# Patient Record
Sex: Male | Born: 1940 | Race: White | Hispanic: No | Marital: Single | Smoking: Never smoker
Health system: Southern US, Community
[De-identification: ages and names within clinical notes are randomized; demographics above are authoritative.]

## PROBLEM LIST (undated history)

## (undated) DIAGNOSIS — E785 Hyperlipidemia, unspecified: Secondary | ICD-10-CM

## (undated) DIAGNOSIS — R42 Dizziness and giddiness: Secondary | ICD-10-CM

## (undated) DIAGNOSIS — K5790 Diverticulosis of intestine, part unspecified, without perforation or abscess without bleeding: Secondary | ICD-10-CM

## (undated) DIAGNOSIS — M1990 Unspecified osteoarthritis, unspecified site: Secondary | ICD-10-CM

## (undated) DIAGNOSIS — T8859XA Other complications of anesthesia, initial encounter: Secondary | ICD-10-CM

## (undated) DIAGNOSIS — E039 Hypothyroidism, unspecified: Secondary | ICD-10-CM

## (undated) HISTORY — DX: Hyperlipidemia, unspecified: E78.5

## (undated) HISTORY — DX: Hypothyroidism, unspecified: E03.9

## (undated) HISTORY — DX: Diverticulosis of intestine, part unspecified, without perforation or abscess without bleeding: K57.90

## (undated) HISTORY — PX: HERNIA REPAIR: SHX51

---

## 2001-12-01 ENCOUNTER — Ambulatory Visit (HOSPITAL_COMMUNITY): Admission: RE | Admit: 2001-12-01 | Discharge: 2001-12-01 | Payer: Self-pay | Admitting: Family Medicine

## 2001-12-01 ENCOUNTER — Encounter: Payer: Self-pay | Admitting: Family Medicine

## 2002-01-03 ENCOUNTER — Ambulatory Visit (HOSPITAL_COMMUNITY): Admission: RE | Admit: 2002-01-03 | Discharge: 2002-01-03 | Payer: Self-pay | Admitting: Internal Medicine

## 2002-01-03 HISTORY — PX: COLONOSCOPY: SHX174

## 2004-10-29 ENCOUNTER — Ambulatory Visit (HOSPITAL_COMMUNITY): Admission: RE | Admit: 2004-10-29 | Discharge: 2004-10-29 | Payer: Self-pay | Admitting: Family Medicine

## 2004-11-11 ENCOUNTER — Ambulatory Visit: Payer: Self-pay | Admitting: *Deleted

## 2004-11-25 ENCOUNTER — Ambulatory Visit (HOSPITAL_COMMUNITY): Admission: RE | Admit: 2004-11-25 | Discharge: 2004-11-25 | Payer: Self-pay | Admitting: *Deleted

## 2004-11-25 ENCOUNTER — Ambulatory Visit: Payer: Self-pay | Admitting: Cardiology

## 2004-12-02 ENCOUNTER — Ambulatory Visit: Payer: Self-pay | Admitting: *Deleted

## 2005-08-09 ENCOUNTER — Emergency Department (HOSPITAL_COMMUNITY): Admission: EM | Admit: 2005-08-09 | Discharge: 2005-08-09 | Payer: Self-pay | Admitting: Emergency Medicine

## 2007-01-13 ENCOUNTER — Ambulatory Visit (HOSPITAL_COMMUNITY): Admission: RE | Admit: 2007-01-13 | Discharge: 2007-01-13 | Payer: Self-pay | Admitting: Internal Medicine

## 2007-01-13 ENCOUNTER — Ambulatory Visit: Payer: Self-pay | Admitting: Internal Medicine

## 2007-01-13 HISTORY — PX: COLONOSCOPY: SHX174

## 2007-04-21 ENCOUNTER — Ambulatory Visit (HOSPITAL_COMMUNITY): Admission: RE | Admit: 2007-04-21 | Discharge: 2007-04-21 | Payer: Self-pay | Admitting: Family Medicine

## 2009-03-14 ENCOUNTER — Ambulatory Visit (HOSPITAL_COMMUNITY): Admission: RE | Admit: 2009-03-14 | Discharge: 2009-03-14 | Payer: Self-pay | Admitting: General Surgery

## 2009-03-14 ENCOUNTER — Encounter (INDEPENDENT_AMBULATORY_CARE_PROVIDER_SITE_OTHER): Payer: Self-pay | Admitting: General Surgery

## 2009-03-14 HISTORY — PX: HEMORRHOID SURGERY: SHX153

## 2009-11-07 ENCOUNTER — Ambulatory Visit (HOSPITAL_COMMUNITY): Admission: RE | Admit: 2009-11-07 | Discharge: 2009-11-07 | Payer: Self-pay | Admitting: Family Medicine

## 2010-05-21 ENCOUNTER — Ambulatory Visit (HOSPITAL_COMMUNITY): Admission: RE | Admit: 2010-05-21 | Discharge: 2010-05-21 | Payer: Self-pay | Admitting: Family Medicine

## 2010-11-03 ENCOUNTER — Encounter: Payer: Self-pay | Admitting: *Deleted

## 2011-01-21 LAB — BASIC METABOLIC PANEL
BUN: 12 mg/dL (ref 6–23)
Creatinine, Ser: 0.83 mg/dL (ref 0.4–1.5)
GFR calc non Af Amer: >60 mL/min (ref >60)
Glucose, Bld: 99 mg/dL (ref 70–99)
Potassium: 4 mEq/L (ref 3.5–5.1)

## 2011-01-21 LAB — DIFFERENTIAL
Lymphocytes Relative: 36 % (ref 12–46)
Lymphs Abs: 2.2 10*3/uL (ref 0.7–4.0)
Neutrophils Relative %: 45 % (ref 43–77)

## 2011-01-21 LAB — CBC
HCT: 41 % (ref 39.0–52.0)
Platelets: 163 10*3/uL (ref 150–400)
WBC: 6.1 10*3/uL (ref 4.0–10.5)

## 2011-02-25 NOTE — Op Note (Signed)
NAME:  Carlos Butler, Carlos Butler NO.:  [id number]   MEDICAL RECORD NO.:  [id number]          PATIENT TYPE:  AMB   LOCATION:  DAY                           FACILITY:  [facility]   PHYSICIAN:  Barbaraann Barthel, M.D. DATE OF BIRTH:  1941/09/09   DATE OF PROCEDURE:  03/14/2009  DATE OF DISCHARGE:                               OPERATIVE REPORT   SURGEON:  Barbaraann Barthel, MD   PREOPERATIVE DIAGNOSIS:  External hemorrhoids with large external  hemorrhoidal tag.   POSTOPERATIVE DIAGNOSIS:  External hemorrhoids with large external  hemorrhoidal tag.   PROCEDURE:  1. External hemorrhoidectomy.  2. Rigid proctoscopy to 25 cm.   NOTE:  This is a 70 year old white male who had long-term perianal  discomfort.  He had a rather large hemorrhoidal tag on the left lateral  position, and he had some smaller internal hemorrhoids that were seen on  anoscopy that did not appear that they required surgery.  The external  component was more of the problem.  We discussed an external  hemorrhoidectomy and the excision of the hemorrhoidal tags with him,  preoperatively discussing complications not limited to but including  bleeding and recurrence and the importance of not straining or  increasing intra-abdominal pressure in the healing process.  Informed  consent was obtained.   GROSS OPERATIVE FINDINGS:  SPECIMEN:  Large external hemorrhoidal tag.  This was sent in formalin to  Pathology.   WOUND CLASSIFICATION:  Contaminated.   TECHNIQUE:  The patient was placed in the jackknife prone position, and  the buttocks were separated with large tape after the adequate  administration of caudal anesthesia.  A digital exam was performed and  then a rigid proctoscopy to 25 cm was performed.  Rectal mucosa appeared  normal to 25 cm.  The prep was not all that clean.  However, we were  able to see with a proctoscope to 25 cm without a problem.  There were  no other abnormalities.  The internal  hemorrhoids as mentioned were  small and did not in my opinion required excision.  We then placed an  hemorrhoidal clamp around the external hemorrhoidal components,  particularly this large external hemorrhoidal tag in the left lateral  position.  We removed this with a cautery device and oversewed it with 2-  0 chromic suture.  We checked for hemostasis.  We then performed a  perianal block using approximately 10 mL of 0.5% Sensorcaine and  then applied a dressing with 2% viscous Xylocaine.  Prior to closure,  all sponge, needle, instrument counts were found to be correct.  Estimated blood loss was minimal.  The patient tolerated the procedure  well, was taken to recovery room in satisfactory condition.      Barbaraann Barthel, M.D.  Electronically Signed     WB/MEDQ  D:  03/14/2009  T:  03/14/2009  Job:  [id number]   cc:   Angus G. Renard Matter, MD  Fax: [phone]

## 2011-02-28 NOTE — Op Note (Signed)
[facility]  Patient:    Carlos Butler, Carlos Butler Visit Number: [id number] MRN: [id number]          Service Type: END Location: DAY Attending Physician:  Jonathon Bellows Dictated by:   Roetta Sessions, M.D. Proc. Date: 01/03/02 Admit Date:  01/03/2002   CC:         Butch Penny, M.D.   Operative Report  PROCEDURE:  Screening colonoscopy.  ENDOSCOPIST:  Roetta Sessions, M.D.  INDICATIONS:  The patient is a 70 year old gentleman referred out of the courtesy of Dr. Butch Penny for colorectal cancer screening.  He is devoid of any GI symptoms.  He has never had colonoscopy.  His brother recently had a polyp removed from his colon which contained carcinoma in situ.  Colonoscopy is now being done as a standard screening maneuver.  This approach has been discussed with the patient at length at the bedside.  Potential risks, benefits, and alternatives have been reviewed and questions answered.  He is agreeable.  He is low risk for conscious sedation.  Please see my handwritten H&P for more information.  DESCRIPTION OF PROCEDURE:  The patient was placed in the left lateral decubitus position.  Oxygen saturation, blood pressure, pulse and respirations were monitored throughout the entire procedure.  Conscious sedation with Demerol 75 mg IV, Versed 3 mg IV in divided doses.  Instrument was Olympus video colonoscope.  Findings:  Digital examination of the rectum demonstrated no abnormalities.  Endoscopic findings:  The prep was excellent.  Rectum:  Examination of the rectal mucosa including the retroflexed view of the anal verge revealed no abnormalities.  Colon:  The colonic mucosa was surveyed from the rectosigmoid junction.  The scope was advanced through the low transverse right colon to the area of the appendiceal orifice, ileocecal valve and cecum.  The patient has a few scattered left sided diverticulum.  The remainder of the colonic mucosa appeared  normal.  The cecum and ileocecal valve, appendiceal orifice were well seen and photographed.  From this level, the scope was slowly withdrawn.  All previously mentioned mucosal surfaces were again seen and again no other abnormalities were observed.  The patient tolerated the procedure well and was reactive in endoscopy.  IMPRESSION: 1. Normal rectum and a few scattered left sided diverticula. 2. The remainder of the colonic mucosa appeared normal.  RECOMMENDATIONS: 1. Diverticulosis, literature provided to the patient. 2. He should return for repeat colonoscopy in five years. Dictated by:   Roetta Sessions, M.D. Attending Physician:  Jonathon Bellows DD:  01/03/02 TD:  01/04/02 Job: [id number]

## 2011-02-28 NOTE — Op Note (Signed)
NAME:  Carlos Butler, KLUENDER NO.:  [id number]   MEDICAL RECORD NO.:  [id number]          PATIENT TYPE:  AMB   LOCATION:  DAY                           FACILITY:  [facility]   PHYSICIAN:  R. Roetta Sessions, M.D. DATE OF BIRTH:  [birth year]   DATE OF PROCEDURE:  01/13/2007  DATE OF DISCHARGE:                               OPERATIVE REPORT   PROCEDURE:  Screening colonoscopy.   INDICATIONS FOR PROCEDURE:  The patient 70 year old gentleman with  positive family history of malignant colonic polyps in a brother.  He  comes for colorectal cancer screening.  He has no lower GI tract  symptoms.  He last had a colonoscopy 5 years ago, was found to have  diverticulosis.  This approach has been discussed with the patient at  length.  Potential risks, benefits and alternatives have been reviewed,  questions answered.  He is agreeable.  Please see documentation in the  medical record.   PROCEDURE NOTE:  O2 saturation, blood pressure, pulse and respirations  monitored throughout the entire procedure.  Conscious sedation Demerol  50 mg IV and Versed 2 mg IV.   INSTRUMENT:  Pentax video chip system.   FINDINGS:  Digital rectal exam revealed a single small external  hemorrhoidal tag, otherwise negative.   ENDOSCOPIC FINDINGS:  The prep was good, examination of colonic mucosa  was undertaken from the rectosigmoid junction.  Scope was advanced to  the left, transverse, right colon to the area of appendiceal orifice,  ileocecal valve and cecum.  These structures were well seen photographed  for the record.  From this level scope was slowly cautiously withdrawn.  All previously mentioned mucosal surfaces were again seen.  The patient  had a small mouth left-sided transverse diverticula.  Colon mucosa  appeared normal.  The colon was somewhat elongated and tortuous.  The  scope was pulled down into the rectum where a thorough examination of  the rectal mucosa including retroflex view anal verge  revealed no  abnormalities.  The patient tolerated the procedure well was reacted in  endoscopy.   IMPRESSION:  Single small external hemorrhoidal tag, otherwise normal  rectum, left-sided amd transverse diverticula.  Remainder colonic mucosa  appeared normal.   RECOMMENDATIONS:  1. Diverticulosis literature provided to Mr. Correa.  2. Repeat screening colonoscopy 5 years.      Jonathon Bellows, M.D.  Electronically Signed     RMR/MEDQ  D:  01/13/2007  T:  01/13/2007  Job:  [id number]   cc:   Angus G. Renard Matter, MD  Fax: [phone]

## 2011-10-13 ENCOUNTER — Other Ambulatory Visit (HOSPITAL_COMMUNITY): Payer: Self-pay | Admitting: Family Medicine

## 2011-10-13 ENCOUNTER — Ambulatory Visit (HOSPITAL_COMMUNITY)
Admission: RE | Admit: 2011-10-13 | Discharge: 2011-10-13 | Disposition: A | Discharge status: Home / Self Care | Payer: Medicare Other | Source: Ambulatory Visit | Attending: Family Medicine | Admitting: Family Medicine

## 2011-10-13 DIAGNOSIS — R509 Fever, unspecified: Secondary | ICD-10-CM

## 2011-10-13 DIAGNOSIS — R059 Cough, unspecified: Secondary | ICD-10-CM

## 2011-10-13 DIAGNOSIS — R05 Cough: Secondary | ICD-10-CM

## 2011-10-13 DIAGNOSIS — R0989 Other specified symptoms and signs involving the circulatory and respiratory systems: Secondary | ICD-10-CM | POA: Insufficient documentation

## 2012-01-08 ENCOUNTER — Encounter: Payer: Self-pay | Admitting: Internal Medicine

## 2012-02-20 ENCOUNTER — Encounter: Payer: Self-pay | Admitting: Internal Medicine

## 2012-02-20 ENCOUNTER — Ambulatory Visit (INDEPENDENT_AMBULATORY_CARE_PROVIDER_SITE_OTHER): Payer: Medicare Other | Admitting: Internal Medicine

## 2012-02-20 VITALS — BP 102/65 | HR 73 | Temp 97.6°F | Ht 74.0 in | Wt 250.8 lb

## 2012-02-20 DIAGNOSIS — Z8371 Family history of colonic polyps: Secondary | ICD-10-CM

## 2012-02-20 NOTE — Progress Notes (Signed)
Primary Care Physician:  MCINNIS,ANGUS G, MD, MD Primary Gastroenterologist:  Dr.   Pre-Procedure History & Physical: HPI:  Carlos Butler is a 71 y.o. male here for consideration of scheduling high risk screening colonoscopy. Brother with a history of malignant colon polyps at a relatively young age. His brother actually succumbed to lung cancer, however. Colonoscopy 5 years ago demonstrated left-sided diverticulosis. Since I saw him previously, he developed severe pruritus ani by his description and ultimately underwent what sounds like hemorrhoidal procedure by Dr. Bradford. Since that time has done well. No bowel symptoms currently. No rectal bleeding or abdominal pain. No other positive family history. No other significant intercurrent illness or surgery.  Past Medical History  Diagnosis Date  . Diverticulosis   . Hemorrhoids   . Hypothyroidism   . Hyperlipidemia     Past Surgical History  Procedure Date  . Colonoscopy 01/03/2002    diverticulosis  . Colonoscopy 01/13/2007    Dr. Sarita Hakanson- small external hemorrhoidal tag, L side and transverse diverticula  . Hemorrhoid surgery 03/14/2009    Dr. Bradford    Prior to Admission medications   Medication Sig Start Date End Date Taking? Authorizing Provider  aspirin 81 MG tablet Take 81 mg by mouth daily.   Yes Historical Provider, MD  NIASPAN 1000 MG CR tablet Take 1,000 mg by mouth at bedtime.  02/16/12  Yes Historical Provider, MD  simvastatin (ZOCOR) 80 MG tablet Take 80 mg by mouth at bedtime.  01/15/12  Yes Historical Provider, MD  SYNTHROID 112 MCG tablet Take 112 mcg by mouth daily.  02/16/12  Yes Historical Provider, MD    Allergies as of 02/20/2012  . (No Known Allergies)    Family History  Problem Relation Age of Onset  . Colon polyps Brother     malignant    History   Social History  . Marital Status: Single    Spouse Name: N/A    Number of Children: N/A  . Years of Education: N/A   Occupational History  . Not on  file.   Social History Main Topics  . Smoking status: Never Smoker   . Smokeless tobacco: Not on file  . Alcohol Use: No  . Drug Use: No  . Sexually Active: Not on file   Other Topics Concern  . Not on file   Social History Narrative  . No narrative on file    Review of Systems: See HPI, otherwise negative ROS  Physical Exam: BP 102/65  Pulse 73  Temp(Src) 97.6 F (36.4 C) (Temporal)  Ht 6' 2" (1.88 m)  Wt 250 lb 12.8 oz (113.762 kg)  BMI 32.20 kg/m2 General:   Alert,  Well-developed, well-nourished, pleasant and cooperative in NAD Skin:  Intact without significant lesions or rashes. Eyes:  Sclera clear, no icterus.   Conjunctiva pink. Ears:  Normal auditory acuity. Nose:  No deformity, discharge,  or lesions. Mouth:  No deformity or lesions. Neck:  Supple; no masses or thyromegaly. No significant cervical adenopathy. Lungs:  Clear throughout to auscultation.   No wheezes, crackles, or rhonchi. No acute distress. Heart:  Regular rate and rhythm; no murmurs, clicks, rubs,  or gallops. Abdomen: Non-distended, normal bowel sounds.  Soft and nontender without appreciable mass or hepatosplenomegaly.  Pulses:  Normal pulses noted. Extremities:  Without clubbing or edema.  

## 2012-02-20 NOTE — Patient Instructions (Signed)
Will schedule a high risk screening colonoscopy in the near future

## 2012-02-20 NOTE — Assessment & Plan Note (Signed)
Pleasant 71 year old gentleman with a positive family history of early colon cancer in a first-degree relative. He had a negative colonoscopy 5 years ago. At this time I have offered him a high risk screening colonoscopy. The risks, benefits, limitations, alternatives and imponderables have been reviewed with the patient. Questions have been answered. All parties are agreeable.

## 2012-02-23 ENCOUNTER — Other Ambulatory Visit: Payer: Self-pay | Admitting: Gastroenterology

## 2012-02-23 DIAGNOSIS — Z800 Family history of malignant neoplasm of digestive organs: Secondary | ICD-10-CM

## 2012-02-23 MED ORDER — PEG-KCL-NACL-NASULF-NA ASC-C 100 G PO SOLR: 1.0000 | Freq: Once | ORAL | Status: DC

## 2012-03-03 ENCOUNTER — Encounter (HOSPITAL_COMMUNITY): Payer: Self-pay | Admitting: Pharmacy Technician

## 2012-03-04 MED ORDER — SODIUM CHLORIDE 0.45 % IV SOLN
Freq: Once | INTRAVENOUS | Status: AC
  Administered 2012-03-05: 07:00:00 via INTRAVENOUS

## 2012-03-05 ENCOUNTER — Encounter (HOSPITAL_COMMUNITY): Payer: Self-pay

## 2012-03-05 ENCOUNTER — Encounter (HOSPITAL_COMMUNITY)
Admission: RE | Disposition: A | Discharge status: Home / Self Care | Payer: Self-pay | Source: Ambulatory Visit | Attending: Internal Medicine

## 2012-03-05 ENCOUNTER — Ambulatory Visit (HOSPITAL_COMMUNITY)
Admission: RE | Admit: 2012-03-05 | Discharge: 2012-03-05 | Disposition: A | Discharge status: Home / Self Care | Payer: Medicare Other | Source: Ambulatory Visit | Attending: Internal Medicine | Admitting: Internal Medicine

## 2012-03-05 DIAGNOSIS — Z1211 Encounter for screening for malignant neoplasm of colon: Secondary | ICD-10-CM

## 2012-03-05 DIAGNOSIS — Z800 Family history of malignant neoplasm of digestive organs: Secondary | ICD-10-CM

## 2012-03-05 DIAGNOSIS — K5730 Diverticulosis of large intestine without perforation or abscess without bleeding: Secondary | ICD-10-CM | POA: Insufficient documentation

## 2012-03-05 DIAGNOSIS — D126 Benign neoplasm of colon, unspecified: Secondary | ICD-10-CM

## 2012-03-05 DIAGNOSIS — E785 Hyperlipidemia, unspecified: Secondary | ICD-10-CM | POA: Insufficient documentation

## 2012-03-05 DIAGNOSIS — Z7982 Long term (current) use of aspirin: Secondary | ICD-10-CM | POA: Insufficient documentation

## 2012-03-05 HISTORY — PX: COLONOSCOPY: SHX5424

## 2012-03-05 SURGERY — COLONOSCOPY
Anesthesia: Moderate Sedation

## 2012-03-05 MED ORDER — MIDAZOLAM HCL 5 MG/5ML IJ SOLN
INTRAMUSCULAR | Status: DC
Start: 2012-03-05 — End: 2012-03-05
  Filled 2012-03-05: qty 10

## 2012-03-05 MED ORDER — STERILE WATER FOR IRRIGATION IR SOLN
Status: DC | PRN
  Administered 2012-03-05: 08:00:00

## 2012-03-05 MED ORDER — MEPERIDINE HCL 100 MG/ML IJ SOLN
INTRAMUSCULAR | Status: DC | PRN
  Administered 2012-03-05: 50 mg via INTRAVENOUS
  Administered 2012-03-05: 25 mg via INTRAVENOUS

## 2012-03-05 MED ORDER — MIDAZOLAM HCL 5 MG/5ML IJ SOLN
INTRAMUSCULAR | Status: DC | PRN
  Administered 2012-03-05: 1 mg via INTRAVENOUS
  Administered 2012-03-05: 2 mg via INTRAVENOUS

## 2012-03-05 MED ORDER — MEPERIDINE HCL 100 MG/ML IJ SOLN
INTRAMUSCULAR | Status: AC
  Filled 2012-03-05: qty 1

## 2012-03-05 NOTE — Interval H&P Note (Signed)
History and Physical Interval Note:  03/05/2012 7:36 AM  Carlos Butler  has presented today for surgery, with the diagnosis of family hx of CRC  The various methods of treatment have been discussed with the patient and family. After consideration of risks, benefits and other options for treatment, the patient has consented to  Procedure(s) (LRB): COLONOSCOPY (N/A) as a surgical intervention .  The patients' history has been reviewed, patient examined, no change in status, stable for surgery.  I have reviewed the patients' chart and labs.  Questions were answered to the patient's satisfaction.     Eula Listen

## 2012-03-05 NOTE — Discharge Instructions (Addendum)
Colonoscopy Discharge Instructions  Read the instructions outlined below and refer to this sheet in the next few weeks. These discharge instructions provide you with general information on caring for yourself after you leave the hospital. Your doctor may also give you specific instructions. While your treatment has been planned according to the most current medical practices available, unavoidable complications occasionally occur. If you have any problems or questions after discharge, call Dr. Jena Gauss at [phone]. ACTIVITY  You may resume your regular activity, but move at a slower pace for the next 24 hours.   Take frequent rest periods for the next 24 hours.   Walking will help get rid of the air and reduce the bloated feeling in your belly (abdomen).   No driving for 24 hours (because of the medicine (anesthesia) used during the test).    Do not sign any important legal documents or operate any machinery for 24 hours (because of the anesthesia used during the test).  NUTRITION  Drink plenty of fluids.   You may resume your normal diet as instructed by your doctor.   Begin with a light meal and progress to your normal diet. Heavy or fried foods are harder to digest and may make you feel sick to your stomach (nauseated).   Avoid alcoholic beverages for 24 hours or as instructed.  MEDICATIONS  You may resume your normal medications unless your doctor tells you otherwise.  WHAT YOU CAN EXPECT TODAY  Some feelings of bloating in the abdomen.   Passage of more gas than usual.   Spotting of blood in your stool or on the toilet paper.  IF YOU HAD POLYPS REMOVED DURING THE COLONOSCOPY:  No aspirin products for 7 days or as instructed.   No alcohol for 7 days or as instructed.   Eat a soft diet for the next 24 hours.  FINDING OUT THE RESULTS OF YOUR TEST Not all test results are available during your visit. If your test results are not back during the visit, make an appointment  with your caregiver to find out the results. Do not assume everything is normal if you have not heard from your caregiver or the medical facility. It is important for you to follow up on all of your test results.  SEEK IMMEDIATE MEDICAL ATTENTION IF:  You have more than a spotting of blood in your stool.   Your belly is swollen (abdominal distention).   You are nauseated or vomiting.   You have a temperature over 101.   You have abdominal pain or discomfort that is severe or gets worse throughout the day.   Diverticulosis and polyp information provided.  Further recommendations to follow pending review of pathology report.  Diverticulosis Diverticulosis is a common condition that develops when small pouches (diverticula) form in the wall of the colon. The risk of diverticulosis increases with age. It happens more often in people who eat a low-fiber diet. Most individuals with diverticulosis have no symptoms. Those individuals with symptoms usually experience abdominal pain, constipation, or loose stools (diarrhea). HOME CARE INSTRUCTIONS   Increase the amount of fiber in your diet as directed by your caregiver or dietician. This may reduce symptoms of diverticulosis.   Your caregiver may recommend taking a dietary fiber supplement.   Drink at least 6 to 8 glasses of water each day to prevent constipation.   Try not to strain when you have a bowel movement.   Your caregiver may recommend avoiding nuts and seeds to prevent complications,  although this is still an uncertain benefit.   Only take over-the-counter or prescription medicines for pain, discomfort, or fever as directed by your caregiver.  FOODS WITH HIGH FIBER CONTENT INCLUDE:  Fruits. Apple, peach, pear, tangerine, raisins, prunes.   Vegetables. Brussels sprouts, asparagus, broccoli, cabbage, carrot, cauliflower, romaine lettuce, spinach, summer squash, tomato, winter squash, zucchini.   Starchy Vegetables. Baked beans,  kidney beans, lima beans, split peas, lentils, potatoes (with skin).   Grains. Whole wheat bread, brown rice, bran flake cereal, plain oatmeal, white rice, shredded wheat, bran muffins.  SEEK IMMEDIATE MEDICAL CARE IF:   You develop increasing pain or severe bloating.   You have an oral temperature above 102 F (38.9 C), not controlled by medicine.   You develop vomiting or bowel movements that are bloody or black.  Document Released: 06/26/2004 Document Revised: 09/18/2011 Document Reviewed: 02/27/2010 [facility] Patient Information 2012 [place], [place].  Colon Polyps A polyp is extra tissue that grows inside your body. Colon polyps grow in the large intestine. The large intestine, also called the colon, is part of your digestive system. It is a long, hollow tube at the end of your digestive tract where your body makes and stores stool. Most polyps are not dangerous. They are benign. This means they are not cancerous. But over time, some types of polyps can turn into cancer. Polyps that are smaller than a pea are usually not harmful. But larger polyps could someday become or may already be cancerous. To be safe, doctors remove all polyps and test them.  WHO GETS POLYPS? Anyone can get polyps, but certain people are more likely than others. You may have a greater chance of getting polyps if:  You are over 50.   You have had polyps before.   Someone in your family has had polyps.   Someone in your family has had cancer of the large intestine.   Find out if someone in your family has had polyps. You may also be more likely to get polyps if you:   Eat a lot of fatty foods.   Smoke.   Drink alcohol.   Do not exercise.   Eat too much.  SYMPTOMS  Most small polyps do not cause symptoms. People often do not know they have one until their caregiver finds it during a regular checkup or while testing them for something else. Some people do have symptoms like these:  Bleeding from the  anus. You might notice blood on your underwear or on toilet paper after you have had a bowel movement.   Constipation or diarrhea that lasts more than a week.   Blood in the stool. Blood can make stool look black or it can show up as red streaks in the stool.  If you have any of these symptoms, see your caregiver. HOW DOES THE DOCTOR TEST FOR POLYPS? The doctor can use four tests to check for polyps:  Digital rectal exam. The caregiver wears gloves and checks your rectum (the last part of the large intestine) to see if it feels normal. This test would find polyps only in the rectum. Your caregiver may need to do one of the other tests listed below to find polyps higher up in the intestine.   Barium enema. The caregiver puts a liquid called barium into your rectum before taking x-rays of your large intestine. Barium makes your intestine look white in the pictures. Polyps are dark, so they are easy to see.   Sigmoidoscopy. With this test,  the caregiver can see inside your large intestine. A thin flexible tube is placed into your rectum. The device is called a sigmoidoscope, which has a light and a tiny video camera in it. The caregiver uses the sigmoidoscope to look at the last third of your large intestine.   Colonoscopy. This test is like sigmoidoscopy, but the caregiver looks at all of the large intestine. It usually requires sedation. This is the most common method for finding and removing polyps.  TREATMENT   The caregiver will remove the polyp during sigmoidoscopy or colonoscopy. The polyp is then tested for cancer.   If you have had polyps, your caregiver may want you to get tested regularly in the future.  PREVENTION  There is not one sure way to prevent polyps. You might be able to lower your risk of getting them if you:  Eat more fruits and vegetables and less fatty food.   Do not smoke.   Avoid alcohol.   Exercise every day.   Lose weight if you are overweight.   Eating  more calcium and folate can also lower your risk of getting polyps. Some foods that are rich in calcium are milk, cheese, and broccoli. Some foods that are rich in folate are chickpeas, kidney beans, and spinach.   Aspirin might help prevent polyps. Studies are under way.  Document Released: 06/25/2004 Document Revised: 09/18/2011 Document Reviewed: 12/01/2007 [facility] Patient Information 2012 [place], [place].

## 2012-03-05 NOTE — Op Note (Signed)
[facility] [address] [place], [place]  [address]  COLONOSCOPY PROCEDURE REPORT  PATIENT:  Carlos Butler, Carlos Butler  MR#:  [id number] BIRTHDATE:  06-24-1941, 70 yrs. old  GENDER:  male ENDOSCOPIST:  R. Roetta Sessions, MD FACP [facility] REF. BY:  Butch Penny, M.D. PROCEDURE DATE:  03/05/2012 PROCEDURE:  Colonoscopy with biopsy  INDICATIONS:  High risk colorectal cancer screening.  INFORMED CONSENT:  The risks, benefits, alternatives and imponderables including but not limited to bleeding, perforation as well as the possibility of a missed lesion have been reviewed. The potential for biopsy, lesion removal, etc. have also been discussed.  Questions have been answered.  All parties agreeable. Please see the history and physical in the medical record for more information.  MEDICATIONS:  Versed 3 mg IV and Demerol 75 mg IV in divided doses.  DESCRIPTION OF PROCEDURE:  After a digital rectal exam was performed, the EC-[id number] ([id number]) colonoscope was advanced from the anus through the rectum and colon to the area of the cecum, ileocecal valve and appendiceal orifice.  The cecum was deeply intubated.  These structures were well-seen and photographed for the record.  From the level of the cecum and ileocecal valve, the scope was slowly and cautiously withdrawn.  The mucosal surfaces were carefully surveyed utilizing scope tip deflection to facilitate fold flattening as needed.  The scope was pulled down into the rectum where a thorough examination including retroflexion was performed. <<PROCEDUREIMAGES>>  FINDINGS: Excellent preparation. Normal rectum. Pancolonic diverticular disease (left sided greater than right). Single diminutive polyp in the mid descending segment; otherwise normal-appearing colonic mucosa.  THERAPEUTIC / DIAGNOSTIC MANEUVERS PERFORMED: The above-mentioned polyp was cold biopsied/removed  COMPLICATIONS:  None  CECAL WITHDRAWAL TIME:  12  minutes  IMPRESSION:   Pancolonic diverticulosis. Single diminutive polyp-removed as described above.  RECOMMENDATIONS:  Followup on pathology.  ______________________________ R. Roetta Sessions, MD Caleen Essex  CC:  Butch Penny, M.D.  n. eSIGNED:   R. Roetta Sessions at 03/05/2012 08:13 AM  Ellwood Handler, [phone]

## 2012-03-05 NOTE — H&P (View-Only) (Signed)
Primary Care Physician:  Alice Reichert, MD, MD Primary Gastroenterologist:  Dr.   Pre-Procedure History & Physical: HPI:  Carlos Butler is a 71 y.o. male here for consideration of scheduling high risk screening colonoscopy. Brother with a history of malignant colon polyps at a relatively young age. His brother actually succumbed to lung cancer, however. Colonoscopy 5 years ago demonstrated left-sided diverticulosis. Since I saw him previously, he developed severe pruritus ani by his description and ultimately underwent what sounds like hemorrhoidal procedure by Dr. Malvin Johns. Since that time has done well. No bowel symptoms currently. No rectal bleeding or abdominal pain. No other positive family history. No other significant intercurrent illness or surgery.  Past Medical History  Diagnosis Date  . Diverticulosis   . Hemorrhoids   . Hypothyroidism   . Hyperlipidemia     Past Surgical History  Procedure Date  . Colonoscopy 01/03/2002    diverticulosis  . Colonoscopy 01/13/2007    Dr. Jena Gauss- small external hemorrhoidal tag, L side and transverse diverticula  . Hemorrhoid surgery 03/14/2009    Dr. Malvin Johns    Prior to Admission medications   Medication Sig Start Date End Date Taking? Authorizing Provider  aspirin 81 MG tablet Take 81 mg by mouth daily.   Yes Historical Provider, MD  NIASPAN 1000 MG CR tablet Take 1,000 mg by mouth at bedtime.  02/16/12  Yes Historical Provider, MD  simvastatin (ZOCOR) 80 MG tablet Take 80 mg by mouth at bedtime.  01/15/12  Yes Historical Provider, MD  SYNTHROID 112 MCG tablet Take 112 mcg by mouth daily.  02/16/12  Yes Historical Provider, MD    Allergies as of 02/20/2012  . (No Known Allergies)    Family History  Problem Relation Age of Onset  . Colon polyps Brother     malignant    History   Social History  . Marital Status: Single    Spouse Name: N/A    Number of Children: N/A  . Years of Education: N/A   Occupational History  . Not on  file.   Social History Main Topics  . Smoking status: Never Smoker   . Smokeless tobacco: Not on file  . Alcohol Use: No  . Drug Use: No  . Sexually Active: Not on file   Other Topics Concern  . Not on file   Social History Narrative  . No narrative on file    Review of Systems: See HPI, otherwise negative ROS  Physical Exam: BP 102/65  Pulse 73  Temp(Src) 97.6 F (36.4 C) (Temporal)  Ht 6\' 2"  (1.88 m)  Wt 250 lb 12.8 oz (113.762 kg)  BMI 32.20 kg/m2 General:   Alert,  Well-developed, well-nourished, pleasant and cooperative in NAD Skin:  Intact without significant lesions or rashes. Eyes:  Sclera clear, no icterus.   Conjunctiva pink. Ears:  Normal auditory acuity. Nose:  No deformity, discharge,  or lesions. Mouth:  No deformity or lesions. Neck:  Supple; no masses or thyromegaly. No significant cervical adenopathy. Lungs:  Clear throughout to auscultation.   No wheezes, crackles, or rhonchi. No acute distress. Heart:  Regular rate and rhythm; no murmurs, clicks, rubs,  or gallops. Abdomen: Non-distended, normal bowel sounds.  Soft and nontender without appreciable mass or hepatosplenomegaly.  Pulses:  Normal pulses noted. Extremities:  Without clubbing or edema.

## 2012-03-10 ENCOUNTER — Encounter (HOSPITAL_COMMUNITY): Payer: Self-pay | Admitting: Internal Medicine

## 2012-03-12 ENCOUNTER — Encounter: Payer: Self-pay | Admitting: Internal Medicine

## 2014-11-27 ENCOUNTER — Other Ambulatory Visit (HOSPITAL_COMMUNITY): Payer: Self-pay | Admitting: Family Medicine

## 2014-11-27 ENCOUNTER — Ambulatory Visit (HOSPITAL_COMMUNITY)
Admission: RE | Admit: 2014-11-27 | Discharge: 2014-11-27 | Disposition: A | Discharge status: Home / Self Care | Payer: Medicare Other | Source: Ambulatory Visit | Attending: Family Medicine | Admitting: Family Medicine

## 2014-11-27 DIAGNOSIS — Z7722 Contact with and (suspected) exposure to environmental tobacco smoke (acute) (chronic): Secondary | ICD-10-CM

## 2016-06-03 DIAGNOSIS — E785 Hyperlipidemia, unspecified: Secondary | ICD-10-CM | POA: Diagnosis not present

## 2016-06-03 DIAGNOSIS — I10 Essential (primary) hypertension: Secondary | ICD-10-CM | POA: Diagnosis not present

## 2016-06-03 DIAGNOSIS — E039 Hypothyroidism, unspecified: Secondary | ICD-10-CM | POA: Diagnosis not present

## 2016-07-07 DIAGNOSIS — I10 Essential (primary) hypertension: Secondary | ICD-10-CM | POA: Diagnosis not present

## 2016-07-07 DIAGNOSIS — E039 Hypothyroidism, unspecified: Secondary | ICD-10-CM | POA: Diagnosis not present

## 2016-07-07 DIAGNOSIS — E782 Mixed hyperlipidemia: Secondary | ICD-10-CM | POA: Diagnosis not present

## 2016-07-09 DIAGNOSIS — Z0000 Encounter for general adult medical examination without abnormal findings: Secondary | ICD-10-CM | POA: Diagnosis not present

## 2016-07-09 DIAGNOSIS — R7301 Impaired fasting glucose: Secondary | ICD-10-CM | POA: Diagnosis not present

## 2016-07-09 DIAGNOSIS — Z23 Encounter for immunization: Secondary | ICD-10-CM | POA: Diagnosis not present

## 2016-07-09 DIAGNOSIS — E785 Hyperlipidemia, unspecified: Secondary | ICD-10-CM | POA: Diagnosis not present

## 2016-07-09 DIAGNOSIS — I10 Essential (primary) hypertension: Secondary | ICD-10-CM | POA: Diagnosis not present

## 2016-07-09 DIAGNOSIS — E039 Hypothyroidism, unspecified: Secondary | ICD-10-CM | POA: Diagnosis not present

## 2017-01-05 DIAGNOSIS — I10 Essential (primary) hypertension: Secondary | ICD-10-CM | POA: Diagnosis not present

## 2017-01-05 DIAGNOSIS — R7301 Impaired fasting glucose: Secondary | ICD-10-CM | POA: Diagnosis not present

## 2017-01-05 DIAGNOSIS — E039 Hypothyroidism, unspecified: Secondary | ICD-10-CM | POA: Diagnosis not present

## 2017-01-07 DIAGNOSIS — E785 Hyperlipidemia, unspecified: Secondary | ICD-10-CM | POA: Diagnosis not present

## 2017-01-07 DIAGNOSIS — I10 Essential (primary) hypertension: Secondary | ICD-10-CM | POA: Diagnosis not present

## 2017-01-07 DIAGNOSIS — E039 Hypothyroidism, unspecified: Secondary | ICD-10-CM | POA: Diagnosis not present

## 2017-02-17 ENCOUNTER — Encounter: Payer: Self-pay | Admitting: Internal Medicine

## 2017-03-03 ENCOUNTER — Telehealth: Payer: Self-pay | Admitting: Internal Medicine

## 2017-03-03 NOTE — Telephone Encounter (Signed)
Pt received a letter from [place] that it was time for his 5 yr colonoscopy. He isn't having any GI problems, no blood thinners or hx of heart attack. He has [employer]. Please call [phone]

## 2017-03-11 ENCOUNTER — Telehealth: Payer: Self-pay

## 2017-03-11 NOTE — Telephone Encounter (Addendum)
Gastroenterology Pre-Procedure Review  Request Date: Requesting Physician: RECALL  PATIENT REVIEW QUESTIONS: The patient responded to the following health history questions as indicated:    1. Diabetes Melitis:NO 2. Joint replacements in the past 12 months: NO 3. Major health problems in the past 3 months: NO 4. Has an artificial valve or MVP: NO 5. Has a defibrillator: NO 6. Has been advised in past to take antibiotics in advance of a procedure like teeth cleaning: NO 7. Family history of colon cancer: NO  8. Alcohol Use: NO 9. History of sleep apnea: NO 10. History of coronary artery or other vascular stents placed within the last 12 months: NO    MEDICATIONS & ALLERGIES:    Patient reports the following regarding taking any blood thinners:   Plavix? NO Aspirin? YES Coumadin? NO Brilinta? NO Xarelto? NO Eliquis? NO Pradaxa? NO Savaysa? NO Effient? NO  Patient confirms/reports the following medications:  Current Outpatient Prescriptions  Medication Sig Dispense Refill  . aspirin EC 81 MG tablet Take 81 mg by mouth at bedtime.    . simvastatin (ZOCOR) 40 MG tablet Take 40 mg by mouth every evening.    Marland Kitchen SYNTHROID 112 MCG tablet Take 125 mcg by mouth daily before breakfast.     . mometasone (NASONEX) 50 MCG/ACT nasal spray Place 2 sprays into the nose daily as needed. For allergies    . NIASPAN 1000 MG CR tablet Take 1,000 mg by mouth at bedtime.     . peg 3350 powder (MOVIPREP) 100 G SOLR Take 1 kit (100 g total) by mouth once. As directed Please purchase 1 Fleets enema to use with the prep (Patient not taking: Reported on 03/11/2017) 1 kit 0   No current facility-administered medications for this visit.     Patient confirms/reports the following allergies:  No Known Allergies  No orders of the defined types were placed in this encounter.   AUTHORIZATION INFORMATION Primary Insurance:MEDICARE ,  ID # [id number] ,  Group #:  Pre-Cert / Auth required:  Pre-Cert /  Auth #:   Secondary Insurance: BCBS ,  ID #:[id number] ,  Group #: [id number] Pre-Cert / Auth required:  Pre-Cert / Auth #:   SCHEDULE INFORMATION: Procedure has been scheduled as follows:  Date: , Time:   Location:   This Gastroenterology Pre-Precedure Review Form is being routed to the following provider(s): R. Garfield Cornea, MD

## 2017-03-11 NOTE — Telephone Encounter (Signed)
See previous note. LMOM for a return call.

## 2017-03-11 NOTE — Telephone Encounter (Signed)
LMOM for a return call.  

## 2017-03-11 NOTE — Telephone Encounter (Signed)
PATIENT CALLED TO SCHEDULE TCS, BEST TO CALL HIS CELL WHEN YOU DO.  [phone]

## 2017-03-12 ENCOUNTER — Telehealth: Payer: Self-pay

## 2017-03-12 NOTE — Telephone Encounter (Signed)
Pt was triaged by Ginger.

## 2017-03-13 NOTE — Telephone Encounter (Signed)
See separate triage.  

## 2017-03-13 NOTE — Telephone Encounter (Signed)
Forwarding to Ginger who triaged pt.  

## 2017-03-13 NOTE — Telephone Encounter (Signed)
Appropriate.

## 2017-03-16 ENCOUNTER — Other Ambulatory Visit: Payer: Self-pay

## 2017-03-16 DIAGNOSIS — Z1211 Encounter for screening for malignant neoplasm of colon: Secondary | ICD-10-CM

## 2017-03-16 MED ORDER — PEG-KCL-NACL-NASULF-NA ASC-C 100 G PO SOLR: 1.0000 | Freq: Once | ORAL | 0 refills | Status: AC

## 2017-03-16 NOTE — Telephone Encounter (Signed)
No PA is needed

## 2017-03-16 NOTE — Telephone Encounter (Signed)
LMOM to call back

## 2017-03-16 NOTE — Telephone Encounter (Signed)
Pt called office. Colonoscopy with Dr. Gala Romney will be 04/21/17 at 7:30am.

## 2017-03-17 ENCOUNTER — Other Ambulatory Visit: Payer: Self-pay

## 2017-03-17 MED ORDER — PEG 3350-KCL-NA BICARB-NACL 420 G PO SOLR: 4000.0000 mL | ORAL | 0 refills | Status: DC

## 2017-03-17 MED ORDER — PEG-KCL-NACL-NASULF-NA ASC-C 100 G PO SOLR: 1.0000 | ORAL | 0 refills | Status: DC

## 2017-04-24 ENCOUNTER — Encounter (HOSPITAL_COMMUNITY): Payer: Self-pay

## 2017-04-24 ENCOUNTER — Encounter (HOSPITAL_COMMUNITY)
Admission: RE | Disposition: A | Discharge status: Home / Self Care | Payer: Self-pay | Source: Ambulatory Visit | Attending: Internal Medicine

## 2017-04-24 ENCOUNTER — Ambulatory Visit (HOSPITAL_COMMUNITY)
Admission: RE | Admit: 2017-04-24 | Discharge: 2017-04-24 | Disposition: A | Discharge status: Home / Self Care | Payer: Medicare Other | Source: Ambulatory Visit | Attending: Internal Medicine | Admitting: Internal Medicine

## 2017-04-24 DIAGNOSIS — Z1211 Encounter for screening for malignant neoplasm of colon: Secondary | ICD-10-CM | POA: Diagnosis not present

## 2017-04-24 DIAGNOSIS — Z86010 Personal history of colonic polyps: Secondary | ICD-10-CM | POA: Insufficient documentation

## 2017-04-24 DIAGNOSIS — Z800 Family history of malignant neoplasm of digestive organs: Secondary | ICD-10-CM | POA: Insufficient documentation

## 2017-04-24 DIAGNOSIS — Z79899 Other long term (current) drug therapy: Secondary | ICD-10-CM | POA: Insufficient documentation

## 2017-04-24 DIAGNOSIS — E785 Hyperlipidemia, unspecified: Secondary | ICD-10-CM | POA: Diagnosis not present

## 2017-04-24 DIAGNOSIS — E039 Hypothyroidism, unspecified: Secondary | ICD-10-CM | POA: Insufficient documentation

## 2017-04-24 DIAGNOSIS — Z7982 Long term (current) use of aspirin: Secondary | ICD-10-CM | POA: Diagnosis not present

## 2017-04-24 HISTORY — PX: COLONOSCOPY: SHX5424

## 2017-04-24 SURGERY — COLONOSCOPY
Anesthesia: Moderate Sedation

## 2017-04-24 MED ORDER — MEPERIDINE HCL 100 MG/ML IJ SOLN
INTRAMUSCULAR | Status: DC | PRN
  Administered 2017-04-24: 25 mg via INTRAVENOUS
  Administered 2017-04-24: 50 mg via INTRAVENOUS
  Administered 2017-04-24: 25 mg via INTRAVENOUS

## 2017-04-24 MED ORDER — MIDAZOLAM HCL 5 MG/5ML IJ SOLN
INTRAMUSCULAR | Status: AC
  Filled 2017-04-24: qty 10

## 2017-04-24 MED ORDER — SODIUM CHLORIDE 0.9 % IV SOLN
INTRAVENOUS | Status: DC
  Administered 2017-04-24: 11:00:00 via INTRAVENOUS

## 2017-04-24 MED ORDER — MIDAZOLAM HCL 5 MG/5ML IJ SOLN
INTRAMUSCULAR | Status: DC | PRN
  Administered 2017-04-24 (×2): 2 mg via INTRAVENOUS
  Administered 2017-04-24: 1 mg via INTRAVENOUS

## 2017-04-24 MED ORDER — ONDANSETRON HCL 4 MG/2ML IJ SOLN
INTRAMUSCULAR | Status: AC
  Filled 2017-04-24: qty 2

## 2017-04-24 MED ORDER — STERILE WATER FOR IRRIGATION IR SOLN
Status: DC | PRN
  Administered 2017-04-24: 2.5 mL

## 2017-04-24 MED ORDER — MEPERIDINE HCL 100 MG/ML IJ SOLN
INTRAMUSCULAR | Status: DC
Start: 2017-04-24 — End: 2017-04-24
  Filled 2017-04-24: qty 2

## 2017-04-24 MED ORDER — ONDANSETRON HCL 4 MG/2ML IJ SOLN
INTRAMUSCULAR | Status: DC | PRN
  Administered 2017-04-24: 4 mg via INTRAVENOUS

## 2017-04-24 NOTE — Discharge Instructions (Addendum)
°  Colonoscopy Discharge Instructions  Read the instructions outlined below and refer to this sheet in the next few weeks. These discharge instructions provide you with general information on caring for yourself after you leave the hospital. Your doctor may also give you specific instructions. While your treatment has been planned according to the most current medical practices available, unavoidable complications occasionally occur. If you have any problems or questions after discharge, call Dr. Gala Romney at [phone]. ACTIVITY  You may resume your regular activity, but move at a slower pace for the next 24 hours.   Take frequent rest periods for the next 24 hours.   Walking will help get rid of the air and reduce the bloated feeling in your belly (abdomen).   No driving for 24 hours (because of the medicine (anesthesia) used during the test).    Do not sign any important legal documents or operate any machinery for 24 hours (because of the anesthesia used during the test).  NUTRITION  Drink plenty of fluids.   You may resume your normal diet as instructed by your doctor.   Begin with a light meal and progress to your normal diet. Heavy or fried foods are harder to digest and may make you feel sick to your stomach (nauseated).   Avoid alcoholic beverages for 24 hours or as instructed.  MEDICATIONS  You may resume your normal medications unless your doctor tells you otherwise.  WHAT YOU CAN EXPECT TODAY  Some feelings of bloating in the abdomen.   Passage of more gas than usual.   Spotting of blood in your stool or on the toilet paper.  IF YOU HAD POLYPS REMOVED DURING THE COLONOSCOPY:  No aspirin products for 7 days or as instructed.   No alcohol for 7 days or as instructed.   Eat a soft diet for the next 24 hours.  FINDING OUT THE RESULTS OF YOUR TEST Not all test results are available during your visit. If your test results are not back during the visit, make an appointment  with your caregiver to find out the results. Do not assume everything is normal if you have not heard from your caregiver or the medical facility. It is important for you to follow up on all of your test results.  SEEK IMMEDIATE MEDICAL ATTENTION IF:  You have more than a spotting of blood in your stool.   Your belly is swollen (abdominal distention).   You are nauseated or vomiting.   You have a temperature over 101.   You have abdominal pain or discomfort that is severe or gets worse throughout the day.     No future colonoscopy unless new symptoms develop

## 2017-04-24 NOTE — H&P (Signed)
@LOGO @   Primary Care Physician:  Celene Squibb, MD Primary Gastroenterologist:  Dr. Gala Romney  Pre-Procedure History & Physical: HPI:  Carlos Butler is a 76 y.o. male here for surveillance colonoscopy. History of colonic polyps and positive family history of precancerous colonic polyps.  Past Medical History:  Diagnosis Date  . Diverticulosis   . Hemorrhoids   . Hyperlipidemia   . Hypothyroidism     Past Surgical History:  Procedure Laterality Date  . COLONOSCOPY  01/03/2002   diverticulosis  . COLONOSCOPY  01/13/2007   Dr. Gala Romney- small external hemorrhoidal tag, L side and transverse diverticula  . COLONOSCOPY  03/05/2012   Procedure: COLONOSCOPY;  Surgeon: Daneil Dolin, MD;  Location: [facility];  Service: Endoscopy;  Laterality: N/A;  7:30  . HEMORRHOID SURGERY  03/14/2009   Dr. Romona Curls  . HERNIA REPAIR     bilateral inguinal hernia    Prior to Admission medications   Medication Sig Start Date End Date Taking? Authorizing Provider  aspirin EC 81 MG tablet Take 81 mg by mouth at bedtime.   Yes [provider]  ibuprofen (ADVIL,MOTRIN) 200 MG tablet Take 400 mg by mouth every 6 (six) hours as needed for moderate pain.   Yes [provider]  levothyroxine (SYNTHROID, LEVOTHROID) 125 MCG tablet Take 125 mcg by mouth daily. 03/07/17  Yes [provider]  Lidocaine-Glycerin (PREPARATION H RE) Place 1 application rectally at bedtime.   Yes [provider]  polyethylene glycol-electrolytes (TRILYTE) 420 g solution Take 4,000 mLs by mouth as directed. 03/17/17  Yes Ceylon Arenson, Cristopher Estimable, MD  simvastatin (ZOCOR) 40 MG tablet Take 40 mg by mouth every evening.   Yes [provider]  Tetrahydrozoline HCl (VISINE OP) Apply 1 drop to eye daily as needed (dry eyes).   Yes [provider]    Allergies as of 03/16/2017  . (No Known Allergies)    Family History  Problem Relation Age of Onset  . Colon polyps Brother        malignant  .  Anesthesia problems Neg Hx   . Hypotension Neg Hx   . Malignant hyperthermia Neg Hx   . Pseudochol deficiency Neg Hx     Social History   Social History  . Marital status: Single    Spouse name: N/A  . Number of children: N/A  . Years of education: N/A   Occupational History  . Not on file.   Social History Main Topics  . Smoking status: Never Smoker  . Smokeless tobacco: Never Used  . Alcohol use No  . Drug use: No  . Sexual activity: Yes   Other Topics Concern  . Not on file   Social History Narrative  . No narrative on file    Review of Systems: See HPI, otherwise negative ROS  Physical Exam: BP 124/74   Pulse 71   Temp 99.1 F (37.3 C) (Oral)   Resp 16   Ht 6\' 2"  (1.88 m)   Wt 220 lb (99.8 kg)   SpO2 98%   BMI 28.25 kg/m  General:   Alert,  Well-developed, well-nourished, pleasant and cooperative in NAD Lungs:  Clear throughout to auscultation.   No wheezes, crackles, or rhonchi. No acute distress. Heart:  Regular rate and rhythm; no murmurs, clicks, rubs,  or gallops. Abdomen: Non-distended, normal bowel sounds.  Soft and nontender without appreciable mass or hepatosplenomegaly.  Pulses:  Normal pulses noted. Extremities:  Without clubbing or edema.  Impression:  Pleasant 76 year old gentleman with a history of colonic adenomas and positive family history. He is here for a nursemaid's colonoscopy.  Recommendations:  I have offered the patient as well as colonoscopy today.The risks, benefits, limitations, alternatives and imponderables have been reviewed with the patient. Questions have been answered. All parties are agreeable.         Notice: This dictation was prepared with Dragon dictation along with smaller phrase technology. Any transcriptional errors that result from this process are unintentional and may not be corrected upon review.

## 2017-04-24 NOTE — Op Note (Signed)
[facility] Patient Name: Carlos Butler Procedure Date: 04/24/2017 11:25 AM MRN: [id number] Date of Birth: 1940/12/05 Attending MD: Norvel Richards , MD CSN: [id number] Age: 76 Admit Type: Outpatient Procedure:                Colonoscopy Indications:              High risk colon cancer surveillance: Personal                            history of colonic polyps Providers:                Norvel Richards, MD, Lurline Del, RN, Randa Spike, Technician Referring MD:              Medicines:                Meperidine 858 mg IV Complications:            No immediate complications. Estimated Blood Loss:     Estimated blood loss: none. Procedure:                Pre-Anesthesia Assessment:                           - Prior to the procedure, a History and Physical                            was performed, and patient medications and                            allergies were reviewed. The patient's tolerance of                            previous anesthesia was also reviewed. The risks                            and benefits of the procedure and the sedation                            options and risks were discussed with the patient.                            All questions were answered, and informed consent                            was obtained. Prior Anticoagulants: The patient has                            taken no previous anticoagulant or antiplatelet                            agents. ASA Grade Assessment: II - A patient with  mild systemic disease. After reviewing the risks                            and benefits, the patient was deemed in                            satisfactory condition to undergo the procedure.                           After obtaining informed consent, the colonoscope                            was passed under direct vision. Throughout the                            procedure, the patient's  blood pressure, pulse, and                            oxygen saturations were monitored continuously. The                            EC-[id number] ([id number]) scope was introduced through                            the anus and advanced to the the cecum, identified                            by appendiceal orifice and ileocecal valve. The                            ileocecal valve, appendiceal orifice, and rectum                            were photographed. Anatomical landmarks were                            photographed. The entire colon was well visualized.                            The entire colon was well visualized. The quality                            of the bowel preparation was adequate. Scope In: 11:48:49 AM Scope Out: 12:07:40 PM Scope Withdrawal Time: 0 hours 9 minutes 29 seconds  Total Procedure Duration: 0 hours 18 minutes 51 seconds  Findings:      The colon (entire examined portion) appeared normal.      No additional abnormalities were found on retroflexion. Impression:               - The entire examined colon is normal.                           - No specimens collected. Moderate Sedation:      Moderate (conscious) sedation was personally administered by the  endoscopist. The following parameters were monitored: oxygen saturation,       heart rate, blood pressure, respiratory rate, EKG, adequacy of pulmonary       ventilation, and response to care. Total physician intraservice time was       30 minutes. Recommendation:           - Patient has a contact number available for                            emergencies. The signs and symptoms of potential                            delayed complications were discussed with the                            patient. Return to normal activities tomorrow.                            Written discharge instructions were provided to the                            patient.                           - Resume previous diet.                            - No repeat colonoscopy due to age.                           - Return to GI clinic PRN. Procedure Code(s):        --- Professional ---                           [phone], Colonoscopy, flexible; diagnostic, including                            collection of specimen(s) by brushing or washing,                            when performed (separate procedure)                           99152, Moderate sedation services provided by the                            same physician or other qualified health care                            professional performing the diagnostic or                            therapeutic service that the sedation supports,                            requiring the presence of an independent trained  observer to assist in the monitoring of the                            patient's level of consciousness and physiological                            status; initial 15 minutes of intraservice time,                            patient age 6 years or older                           [phone], Moderate sedation services; each additional                            15 minutes intraservice time Diagnosis Code(s):        --- Professional ---                           Z86.010, Personal history of colonic polyps CPT copyright 2016 American [employer] Association. All rights reserved. The codes documented in this report are preliminary and upon coder review may  be revised to meet current compliance requirements. Carlos Butler. Mark Benecke, MD Norvel Richards, MD 04/24/2017 12:21:02 PM This report has been signed electronically. Number of Addenda: 0

## 2017-04-28 ENCOUNTER — Encounter (HOSPITAL_COMMUNITY): Payer: Self-pay | Admitting: Internal Medicine

## 2017-07-09 DIAGNOSIS — R7301 Impaired fasting glucose: Secondary | ICD-10-CM | POA: Diagnosis not present

## 2017-07-09 DIAGNOSIS — E039 Hypothyroidism, unspecified: Secondary | ICD-10-CM | POA: Diagnosis not present

## 2017-07-09 DIAGNOSIS — I10 Essential (primary) hypertension: Secondary | ICD-10-CM | POA: Diagnosis not present

## 2017-07-13 DIAGNOSIS — I10 Essential (primary) hypertension: Secondary | ICD-10-CM | POA: Diagnosis not present

## 2017-07-13 DIAGNOSIS — E785 Hyperlipidemia, unspecified: Secondary | ICD-10-CM | POA: Diagnosis not present

## 2017-07-13 DIAGNOSIS — R7301 Impaired fasting glucose: Secondary | ICD-10-CM | POA: Diagnosis not present

## 2017-07-13 DIAGNOSIS — E039 Hypothyroidism, unspecified: Secondary | ICD-10-CM | POA: Diagnosis not present

## 2017-07-13 DIAGNOSIS — Z23 Encounter for immunization: Secondary | ICD-10-CM | POA: Diagnosis not present

## 2017-10-20 DIAGNOSIS — Z6829 Body mass index (BMI) 29.0-29.9, adult: Secondary | ICD-10-CM | POA: Diagnosis not present

## 2017-10-20 DIAGNOSIS — K649 Unspecified hemorrhoids: Secondary | ICD-10-CM | POA: Diagnosis not present

## 2017-10-29 DIAGNOSIS — Z6829 Body mass index (BMI) 29.0-29.9, adult: Secondary | ICD-10-CM | POA: Diagnosis not present

## 2017-10-29 DIAGNOSIS — M79604 Pain in right leg: Secondary | ICD-10-CM | POA: Diagnosis not present

## 2017-10-29 DIAGNOSIS — K649 Unspecified hemorrhoids: Secondary | ICD-10-CM | POA: Diagnosis not present

## 2017-10-30 ENCOUNTER — Encounter: Payer: Self-pay | Admitting: Internal Medicine

## 2017-11-16 DIAGNOSIS — M5136 Other intervertebral disc degeneration, lumbar region: Secondary | ICD-10-CM | POA: Diagnosis not present

## 2017-11-16 DIAGNOSIS — M47816 Spondylosis without myelopathy or radiculopathy, lumbar region: Secondary | ICD-10-CM | POA: Diagnosis not present

## 2017-11-16 DIAGNOSIS — M545 Low back pain: Secondary | ICD-10-CM | POA: Diagnosis not present

## 2017-11-16 DIAGNOSIS — M5441 Lumbago with sciatica, right side: Secondary | ICD-10-CM | POA: Diagnosis not present

## 2017-12-07 DIAGNOSIS — M545 Low back pain: Secondary | ICD-10-CM | POA: Diagnosis not present

## 2017-12-07 DIAGNOSIS — M47816 Spondylosis without myelopathy or radiculopathy, lumbar region: Secondary | ICD-10-CM | POA: Diagnosis not present

## 2017-12-11 ENCOUNTER — Ambulatory Visit: Payer: Medicare Other | Admitting: Internal Medicine

## 2017-12-15 ENCOUNTER — Ambulatory Visit: Payer: Medicare Other | Admitting: Internal Medicine

## 2017-12-15 ENCOUNTER — Encounter: Payer: Self-pay | Admitting: Internal Medicine

## 2017-12-15 ENCOUNTER — Telehealth: Payer: Self-pay

## 2017-12-15 VITALS — BP 121/73 | HR 61 | Temp 97.5°F | Ht 74.0 in | Wt 231.6 lb

## 2017-12-15 DIAGNOSIS — B359 Dermatophytosis, unspecified: Secondary | ICD-10-CM

## 2017-12-15 DIAGNOSIS — K642 Third degree hemorrhoids: Secondary | ICD-10-CM

## 2017-12-15 NOTE — Patient Instructions (Addendum)
Avoid straining.  Limit toilet time to 5-7 minutes  Continue fiber supplement  Canasa 1 gram suppository - one in the rectum at bedtime nightly x 30 days (disp 30 with one refill)  Use over the counter miconazole cream - apply a pea-sized amount to the anorectum 3x daily  Pamphlet on hemorrhoid banding provided  Office visit for re-check and possible banding in 6 weeks

## 2017-12-15 NOTE — Progress Notes (Signed)
Primary Care Physician:  Celene Squibb, MD Primary Gastroenterologist:  Dr. Gala Romney  Pre-Procedure History & Physical: HPI:  Carlos Butler is a 77 y.o. male here for hemorrhoid issues. Has burning and irritation along with prolapse from time to time. Denies constipation or diarrhea. Describes  Taking a  fiber fortified diet. Spends no more than 10 more minutes on the toilet. Scant amount of blood from time to time when he wipes. Uses MiraLAXnearly daily to facilitate bowel function. He denies constipation with this regimen.  Just had a colonoscopy for surveillance given history of polyps back in July of last year. Colon was clean. No future colonoscopy recommended.  Distant history of hemorrhoid surgery in the past.  Past Medical History:  Diagnosis Date  . Diverticulosis   . Hemorrhoids   . Hyperlipidemia   . Hypothyroidism     Past Surgical History:  Procedure Laterality Date  . COLONOSCOPY  01/03/2002   diverticulosis  . COLONOSCOPY  01/13/2007   Dr. Gala Romney- small external hemorrhoidal tag, L side and transverse diverticula  . COLONOSCOPY  03/05/2012   Procedure: COLONOSCOPY;  Surgeon: Daneil Dolin, MD;  Location: [facility];  Service: Endoscopy;  Laterality: N/A;  7:30  . COLONOSCOPY N/A 04/24/2017   Procedure: COLONOSCOPY;  Surgeon: Daneil Dolin, MD;  Location: [facility];  Service: Endoscopy;  Laterality: N/A;  730  . HEMORRHOID SURGERY  03/14/2009   Dr. Romona Curls  . HERNIA REPAIR     bilateral inguinal hernia    Prior to Admission medications   Medication Sig Start Date End Date Taking? Authorizing Provider  aspirin EC 81 MG tablet Take 81 mg by mouth at bedtime.   Yes [provider]  ibuprofen (ADVIL,MOTRIN) 200 MG tablet Take 400 mg by mouth every 6 (six) hours as needed for moderate pain.   Yes [provider]  levothyroxine (SYNTHROID, LEVOTHROID) 125 MCG tablet Take 125 mcg by mouth daily. 03/07/17  Yes [provider]    Lidocaine-Glycerin (PREPARATION H RE) Place 1 application rectally as needed.    Yes [provider]  polyethylene glycol (MIRALAX / GLYCOLAX) packet Take 17 g by mouth daily.   Yes [provider]  simvastatin (ZOCOR) 40 MG tablet Take 20 mg by mouth every evening.    Yes [provider]  Tetrahydrozoline HCl (VISINE OP) Apply 1 drop to eye daily as needed (dry eyes).   Yes [provider]  polyethylene glycol-electrolytes (TRILYTE) 420 g solution Take 4,000 mLs by mouth as directed. Patient not taking: Reported on 12/15/2017 03/17/17   Daneil Dolin, MD    Allergies as of 12/15/2017  . (No Known Allergies)    Family History  Problem Relation Age of Onset  . Colon polyps Brother        malignant  . Anesthesia problems Neg Hx   . Hypotension Neg Hx   . Malignant hyperthermia Neg Hx   . Pseudochol deficiency Neg Hx     Social History   Socioeconomic History  . Marital status: Single    Spouse name: Not on file  . Number of children: Not on file  . Years of education: Not on file  . Highest education level: Not on file  Social Needs  . Financial resource strain: Not on file  . Food insecurity - worry: Not on file  . Food insecurity - inability: Not on file  . Transportation needs - medical: Not on file  . Transportation needs -  non-medical: Not on file  Occupational History  . Not on file  Tobacco Use  . Smoking status: Never Smoker  . Smokeless tobacco: Never Used  Substance and Sexual Activity  . Alcohol use: No  . Drug use: No  . Sexual activity: Yes  Other Topics Concern  . Not on file  Social History Narrative  . Not on file    Review of Systems: See HPI, otherwise negative ROS  Physical Exam: BP 121/73   Pulse 61   Temp (!) 97.5 F (36.4 C) (Oral)   Ht 6\' 2"  (1.88 m)   Wt 231 lb 9.6 oz (105.1 kg)   BMI 29.74 kg/m  General:   Alert,  Well-developed, well-nourished, pleasant and cooperative in NAD Abdomen:  Non-distended, normal bowel sounds.  Soft and nontender without appreciable mass or hepatosplenomegaly.  Pulses:  Normal pulses noted. Extremities:  Without clubbing or edema. Rectal: He has a couple small hemorrhoid tags and some perianal induration/redness. Digital exam good sphincter tone and prostate not enlarged no mass in the rectal vault. 2 columns grade 3 hemorrhoids.  Impression: Very pleasant 77 year old gentleman with symptomatic grade 3 hemorrhoids. He likely also has a coexisting tinea infection. Some significant improvement recently for GI was canceled this appointment.  Recommendations:  Avoid straining.  Limit toilet time to 5-7 minutes  Continue fiber supplement  Canasa 1 gram suppository - one in the rectum at bedtime nightly x 30 days (disp 30 with one refill)  Use over the counter miconazole cream - apply a pea-sized amount to the anorectum 3x daily  Pamphlet on hemorrhoid banding provided  Office visit for re-check and possible banding in 6 weeks       Notice: This dictation was prepared with Dragon dictation along with smaller phrase technology. Any transcriptional errors that result from this process are unintentional and may not be corrected upon review.

## 2017-12-15 NOTE — Telephone Encounter (Signed)
I called the Rx to [place] [facility] [place] for: Canasa 1 gm suppository #30 with one refill from Dr. Gala Romney. One in the rectum at bedtime nightly x 30 days.

## 2018-01-07 DIAGNOSIS — E039 Hypothyroidism, unspecified: Secondary | ICD-10-CM | POA: Diagnosis not present

## 2018-01-07 DIAGNOSIS — I10 Essential (primary) hypertension: Secondary | ICD-10-CM | POA: Diagnosis not present

## 2018-01-07 DIAGNOSIS — Z6829 Body mass index (BMI) 29.0-29.9, adult: Secondary | ICD-10-CM | POA: Diagnosis not present

## 2018-01-07 DIAGNOSIS — K649 Unspecified hemorrhoids: Secondary | ICD-10-CM | POA: Diagnosis not present

## 2018-01-12 DIAGNOSIS — E039 Hypothyroidism, unspecified: Secondary | ICD-10-CM | POA: Diagnosis not present

## 2018-01-12 DIAGNOSIS — R7301 Impaired fasting glucose: Secondary | ICD-10-CM | POA: Diagnosis not present

## 2018-01-12 DIAGNOSIS — Z6829 Body mass index (BMI) 29.0-29.9, adult: Secondary | ICD-10-CM | POA: Diagnosis not present

## 2018-01-12 DIAGNOSIS — E782 Mixed hyperlipidemia: Secondary | ICD-10-CM | POA: Diagnosis not present

## 2018-01-13 ENCOUNTER — Other Ambulatory Visit: Payer: Self-pay | Admitting: Internal Medicine

## 2018-02-23 DIAGNOSIS — E039 Hypothyroidism, unspecified: Secondary | ICD-10-CM | POA: Diagnosis not present

## 2018-02-23 DIAGNOSIS — E785 Hyperlipidemia, unspecified: Secondary | ICD-10-CM | POA: Diagnosis not present

## 2018-02-23 DIAGNOSIS — R7301 Impaired fasting glucose: Secondary | ICD-10-CM | POA: Diagnosis not present

## 2018-02-26 ENCOUNTER — Encounter: Payer: Self-pay | Admitting: Internal Medicine

## 2018-02-26 ENCOUNTER — Ambulatory Visit (INDEPENDENT_AMBULATORY_CARE_PROVIDER_SITE_OTHER): Payer: Medicare Other | Admitting: Internal Medicine

## 2018-02-26 VITALS — BP 116/70 | HR 62 | Temp 97.9°F | Ht 74.0 in | Wt 230.0 lb

## 2018-02-26 DIAGNOSIS — K642 Third degree hemorrhoids: Secondary | ICD-10-CM | POA: Diagnosis not present

## 2018-02-26 DIAGNOSIS — K641 Second degree hemorrhoids: Secondary | ICD-10-CM

## 2018-02-26 DIAGNOSIS — K648 Other hemorrhoids: Secondary | ICD-10-CM

## 2018-02-26 NOTE — Patient Instructions (Signed)
Avoid straining.  Limit toilet time to 5 minutes  Use over-the-counter miconazole cream to the anal area twice daily; stop using other topical preparations.  Call with any interim problems  Schedule followup appointment in 4 weeks from now

## 2018-02-26 NOTE — Progress Notes (Signed)
[place] banding procedure note:  The patient presents with symptomatic grade 2/3 hemorrhoids, unresponsive to maximal medical therapy, requesting rubber band ligation of her hemorrhoidal disease. All risks, benefits, and alternative forms of therapy were described and informed consent was obtained.  In the left lateral decubitus position, anoscopy revealed prominent right anterior column. Otherwise negative.  She had a little perianal redness felt to be possibly a tinea infection  The decision was made to band the right anterior internal hemorrhoid and the [place] was used to perform band ligation without complication. Digital anorectal examination was then performed to assure proper positioning of the band, and to adjust the banded tissue as required. Band found to be in excellent position. No pinching or pain.   Dietary and behavioral recommendations were given;  Miconazole cream to the anorectum twice a day. Avoid other topical agents for now.The patient will return in 4 weeks for followup and possible additional banding as required.  No complications were encountered and the patient tolerated the procedure well.

## 2018-04-01 ENCOUNTER — Ambulatory Visit: Payer: Medicare Other | Admitting: Gastroenterology

## 2018-05-03 ENCOUNTER — Ambulatory Visit: Payer: Medicare Other | Admitting: Gastroenterology

## 2018-07-13 DIAGNOSIS — E039 Hypothyroidism, unspecified: Secondary | ICD-10-CM | POA: Diagnosis not present

## 2018-07-13 DIAGNOSIS — E785 Hyperlipidemia, unspecified: Secondary | ICD-10-CM | POA: Diagnosis not present

## 2018-07-13 DIAGNOSIS — R7301 Impaired fasting glucose: Secondary | ICD-10-CM | POA: Diagnosis not present

## 2018-07-13 DIAGNOSIS — I10 Essential (primary) hypertension: Secondary | ICD-10-CM | POA: Diagnosis not present

## 2018-07-13 DIAGNOSIS — Z125 Encounter for screening for malignant neoplasm of prostate: Secondary | ICD-10-CM | POA: Diagnosis not present

## 2018-07-16 DIAGNOSIS — E039 Hypothyroidism, unspecified: Secondary | ICD-10-CM | POA: Diagnosis not present

## 2018-07-16 DIAGNOSIS — Z0000 Encounter for general adult medical examination without abnormal findings: Secondary | ICD-10-CM | POA: Diagnosis not present

## 2018-07-16 DIAGNOSIS — R7301 Impaired fasting glucose: Secondary | ICD-10-CM | POA: Diagnosis not present

## 2018-07-16 DIAGNOSIS — E782 Mixed hyperlipidemia: Secondary | ICD-10-CM | POA: Diagnosis not present

## 2018-10-18 DIAGNOSIS — Z1283 Encounter for screening for malignant neoplasm of skin: Secondary | ICD-10-CM | POA: Diagnosis not present

## 2018-10-18 DIAGNOSIS — L570 Actinic keratosis: Secondary | ICD-10-CM | POA: Diagnosis not present

## 2018-10-18 DIAGNOSIS — D225 Melanocytic nevi of trunk: Secondary | ICD-10-CM | POA: Diagnosis not present

## 2018-10-18 DIAGNOSIS — L245 Irritant contact dermatitis due to other chemical products: Secondary | ICD-10-CM | POA: Diagnosis not present

## 2018-11-01 ENCOUNTER — Other Ambulatory Visit (HOSPITAL_COMMUNITY): Payer: Self-pay | Admitting: Sports Medicine

## 2018-11-01 DIAGNOSIS — M545 Low back pain, unspecified: Secondary | ICD-10-CM

## 2018-11-01 DIAGNOSIS — M47816 Spondylosis without myelopathy or radiculopathy, lumbar region: Secondary | ICD-10-CM | POA: Diagnosis not present

## 2018-11-08 ENCOUNTER — Ambulatory Visit (HOSPITAL_COMMUNITY)
Admission: RE | Admit: 2018-11-08 | Discharge: 2018-11-08 | Disposition: A | Discharge status: Home / Self Care | Payer: Medicare Other | Source: Ambulatory Visit | Attending: Sports Medicine | Admitting: Sports Medicine

## 2018-11-08 DIAGNOSIS — M545 Low back pain, unspecified: Secondary | ICD-10-CM

## 2018-11-22 DIAGNOSIS — M545 Low back pain: Secondary | ICD-10-CM | POA: Diagnosis not present

## 2018-11-22 DIAGNOSIS — M47816 Spondylosis without myelopathy or radiculopathy, lumbar region: Secondary | ICD-10-CM | POA: Diagnosis not present

## 2018-11-24 DIAGNOSIS — H524 Presbyopia: Secondary | ICD-10-CM | POA: Diagnosis not present

## 2018-12-10 DIAGNOSIS — M545 Low back pain: Secondary | ICD-10-CM | POA: Diagnosis not present

## 2018-12-10 DIAGNOSIS — M5136 Other intervertebral disc degeneration, lumbar region: Secondary | ICD-10-CM | POA: Diagnosis not present

## 2018-12-10 DIAGNOSIS — M5416 Radiculopathy, lumbar region: Secondary | ICD-10-CM | POA: Diagnosis not present

## 2018-12-27 DIAGNOSIS — M545 Low back pain: Secondary | ICD-10-CM | POA: Diagnosis not present

## 2018-12-27 DIAGNOSIS — M5136 Other intervertebral disc degeneration, lumbar region: Secondary | ICD-10-CM | POA: Diagnosis not present

## 2019-02-02 DIAGNOSIS — E785 Hyperlipidemia, unspecified: Secondary | ICD-10-CM | POA: Diagnosis not present

## 2019-02-02 DIAGNOSIS — R7301 Impaired fasting glucose: Secondary | ICD-10-CM | POA: Diagnosis not present

## 2019-02-02 DIAGNOSIS — I10 Essential (primary) hypertension: Secondary | ICD-10-CM | POA: Diagnosis not present

## 2019-02-02 DIAGNOSIS — E039 Hypothyroidism, unspecified: Secondary | ICD-10-CM | POA: Diagnosis not present

## 2019-02-07 DIAGNOSIS — M48062 Spinal stenosis, lumbar region with neurogenic claudication: Secondary | ICD-10-CM | POA: Diagnosis not present

## 2019-02-07 DIAGNOSIS — E039 Hypothyroidism, unspecified: Secondary | ICD-10-CM | POA: Diagnosis not present

## 2019-02-07 DIAGNOSIS — R7301 Impaired fasting glucose: Secondary | ICD-10-CM | POA: Diagnosis not present

## 2019-02-07 DIAGNOSIS — E782 Mixed hyperlipidemia: Secondary | ICD-10-CM | POA: Diagnosis not present

## 2019-03-20 ENCOUNTER — Emergency Department (HOSPITAL_COMMUNITY)
Admission: EM | Admit: 2019-03-20 | Discharge: 2019-03-20 | Disposition: A | Discharge status: Home / Self Care | Payer: Medicare Other | Attending: Emergency Medicine | Admitting: Emergency Medicine

## 2019-03-20 ENCOUNTER — Emergency Department (HOSPITAL_COMMUNITY): Payer: Medicare Other

## 2019-03-20 ENCOUNTER — Encounter (HOSPITAL_COMMUNITY): Payer: Self-pay | Admitting: *Deleted

## 2019-03-20 ENCOUNTER — Other Ambulatory Visit: Payer: Self-pay

## 2019-03-20 DIAGNOSIS — Z79899 Other long term (current) drug therapy: Secondary | ICD-10-CM | POA: Insufficient documentation

## 2019-03-20 DIAGNOSIS — R112 Nausea with vomiting, unspecified: Secondary | ICD-10-CM | POA: Diagnosis not present

## 2019-03-20 DIAGNOSIS — Z7982 Long term (current) use of aspirin: Secondary | ICD-10-CM | POA: Insufficient documentation

## 2019-03-20 DIAGNOSIS — R531 Weakness: Secondary | ICD-10-CM | POA: Diagnosis not present

## 2019-03-20 DIAGNOSIS — R42 Dizziness and giddiness: Secondary | ICD-10-CM | POA: Insufficient documentation

## 2019-03-20 DIAGNOSIS — E039 Hypothyroidism, unspecified: Secondary | ICD-10-CM | POA: Diagnosis not present

## 2019-03-20 HISTORY — DX: Dizziness and giddiness: R42

## 2019-03-20 LAB — CBC WITH DIFFERENTIAL/PLATELET
Abs Immature Granulocytes: 0.01 10*3/uL (ref 0.00–0.07)
Basophils Absolute: 0.1 10*3/uL (ref 0.0–0.1)
Basophils Relative: 1 %
Eosinophils Absolute: 0.4 10*3/uL (ref 0.0–0.5)
Eosinophils Relative: 6 %
HCT: 45.5 % (ref 39.0–52.0)
Hemoglobin: 14.9 g/dL (ref 13.0–17.0)
Immature Granulocytes: 0 %
Lymphocytes Relative: 36 %
Lymphs Abs: 2.2 10*3/uL (ref 0.7–4.0)
MCH: 30.8 pg (ref 26.0–34.0)
MCHC: 32.7 g/dL (ref 30.0–36.0)
MCV: 94.2 fL (ref 80.0–100.0)
Monocytes Absolute: 0.7 10*3/uL (ref 0.1–1.0)
Monocytes Relative: 11 %
Neutro Abs: 2.9 10*3/uL (ref 1.7–7.7)
Neutrophils Relative %: 46 %
Platelets: 160 10*3/uL (ref 150–400)
RBC: 4.83 MIL/uL (ref 4.22–5.81)
RDW: 12.1 % (ref 11.5–15.5)
WBC: 6.3 10*3/uL (ref 4.0–10.5)
nRBC: 0 % (ref 0.0–0.2)

## 2019-03-20 LAB — COMPREHENSIVE METABOLIC PANEL
ALT: 21 U/L (ref 0–44)
AST: 18 U/L (ref 15–41)
Albumin: 3.9 g/dL (ref 3.5–5.0)
Alkaline Phosphatase: 57 U/L (ref 38–126)
Anion gap: 8 (ref 5–15)
BUN: 12 mg/dL (ref 8–23)
CO2: 24 mmol/L (ref 22–32)
Calcium: 8.7 mg/dL — ABNORMAL LOW (ref 8.9–10.3)
Chloride: 107 mmol/L (ref 98–111)
Creatinine, Ser: 0.77 mg/dL (ref 0.61–1.24)
GFR calc Af Amer: >60 mL/min (ref >60)
GFR calc non Af Amer: >60 mL/min (ref >60)
Glucose, Bld: 123 mg/dL — ABNORMAL HIGH (ref 70–99)
Potassium: 3.8 mmol/L (ref 3.5–5.1)
Sodium: 139 mmol/L (ref 135–145)
Total Bilirubin: 0.6 mg/dL (ref 0.3–1.2)
Total Protein: 6.5 g/dL (ref 6.5–8.1)

## 2019-03-20 MED ORDER — ONDANSETRON 4 MG PO TBDP: ORAL_TABLET | ORAL | 0 refills | Status: DC

## 2019-03-20 MED ORDER — SODIUM CHLORIDE 0.9 % IV BOLUS
500.0000 mL | Freq: Once | INTRAVENOUS | Status: AC
  Administered 2019-03-20: 500 mL via INTRAVENOUS

## 2019-03-20 MED ORDER — MECLIZINE HCL 25 MG PO TABS: 25.0000 mg | ORAL_TABLET | Freq: Three times a day (TID) | ORAL | 0 refills | Status: DC | PRN

## 2019-03-20 MED ORDER — MECLIZINE HCL 12.5 MG PO TABS
25.0000 mg | ORAL_TABLET | Freq: Once | ORAL | Status: AC
  Administered 2019-03-20: 25 mg via ORAL
  Filled 2019-03-20: qty 2

## 2019-03-20 NOTE — ED Triage Notes (Signed)
Pt c/o dizziness with n/v that is worse with  Movement that started today,

## 2019-03-20 NOTE — ED Provider Notes (Signed)
[facility] EMERGENCY DEPARTMENT Provider Note   CSN: [id number] Arrival date & time: 03/20/19  2001    History   Chief Complaint Chief Complaint  Patient presents with   Dizziness    HPI Carlos Butler is a 78 y.o. male.     Patient complains of some dizziness.  Patient has a history of vertigo  The history is provided by the patient. No language interpreter was used.  Dizziness  Quality:  Lightheadedness and imbalance Severity:  Mild Onset quality:  Sudden Timing:  Constant Progression:  Waxing and waning Chronicity:  New Context: bending over   Relieved by:  Nothing Worsened by:  Nothing Associated symptoms: no chest pain, no diarrhea and no headaches     Past Medical History:  Diagnosis Date   Diverticulosis    Hemorrhoids    Hyperlipidemia    Hypothyroidism    Vertigo     Patient Active Problem List   Diagnosis Date Noted   Family history of colonic polyps 02/20/2012    Past Surgical History:  Procedure Laterality Date   COLONOSCOPY  01/03/2002   diverticulosis   COLONOSCOPY  01/13/2007   Dr. Gala Romney- small external hemorrhoidal tag, L side and transverse diverticula   COLONOSCOPY  03/05/2012   Procedure: COLONOSCOPY;  Surgeon: Daneil Dolin, MD;  Location: [facility];  Service: Endoscopy;  Laterality: N/A;  7:30   COLONOSCOPY N/A 04/24/2017   Procedure: COLONOSCOPY;  Surgeon: Daneil Dolin, MD;  Location: [facility];  Service: Endoscopy;  Laterality: N/A;  [place]  03/14/2009   Dr. Romona Curls   HERNIA REPAIR     bilateral inguinal hernia        Home Medications    Prior to Admission medications   Medication Sig Start Date End Date Taking? Authorizing Provider  acetaminophen (TYLENOL) 500 MG tablet Take 500-1,000 mg by mouth every 6 (six) hours as needed for mild pain or moderate pain.   Yes [provider]  aspirin EC 81 MG tablet Take 81 mg by mouth every morning.    Yes [provider]    levothyroxine (SYNTHROID) 150 MCG tablet Take 150 mcg by mouth every morning.  03/07/17  Yes [provider]  polyethylene glycol (MIRALAX / GLYCOLAX) packet Take 17 g by mouth every morning. *Mixed with orange juice   Yes [provider]  simvastatin (ZOCOR) 20 MG tablet Take 20 mg by mouth every morning.    Yes [provider]  ibuprofen (ADVIL,MOTRIN) 200 MG tablet Take 400 mg by mouth every 6 (six) hours as needed for moderate pain.    [provider]  meclizine (ANTIVERT) 25 MG tablet Take 1 tablet (25 mg total) by mouth 3 (three) times daily as needed for dizziness. 03/20/19   Milton Ferguson, MD  ondansetron (ZOFRAN ODT) 4 MG disintegrating tablet 4mg  ODT q4 hours prn nausea/vomit 03/20/19   Milton Ferguson, MD  Tetrahydrozoline HCl (VISINE OP) Apply 1 drop to eye daily as needed (dry eyes).    [provider]    Family History Family History  Problem Relation Age of Onset   Colon polyps Brother        malignant   Anesthesia problems Neg Hx    Hypotension Neg Hx    Malignant hyperthermia Neg Hx    Pseudochol deficiency Neg Hx     Social History Social History   Tobacco Use   Smoking status: Never Smoker   Smokeless tobacco: Never Used  Substance Use Topics   Alcohol use: No   Drug use: No     Allergies   Patient has no known allergies.   Review of Systems Review of Systems  Constitutional: Negative for appetite change and fatigue.  HENT: Negative for congestion, ear discharge and sinus pressure.   Eyes: Negative for discharge.  Respiratory: Negative for cough.   Cardiovascular: Negative for chest pain.  Gastrointestinal: Negative for abdominal pain and diarrhea.  Genitourinary: Negative for frequency and hematuria.  Musculoskeletal: Negative for back pain.  Skin: Negative for rash.  Neurological: Positive for dizziness. Negative for seizures and headaches.  Psychiatric/Behavioral: Negative for hallucinations.      Physical Exam Updated Vital Signs BP 116/65    Pulse (!) 57    Temp 98 F (36.7 C) (Oral)    Resp 14    Ht 6\' 2"  (1.88 m)    Wt 104.3 kg    SpO2 95%    BMI 29.53 kg/m   Physical Exam Vitals signs and nursing note reviewed.  Constitutional:      Appearance: He is well-developed.  HENT:     Head: Normocephalic.     Nose: Nose normal.  Eyes:     General: No scleral icterus.    Conjunctiva/sclera: Conjunctivae normal.  Neck:     Musculoskeletal: Neck supple.     Thyroid: No thyromegaly.  Cardiovascular:     Rate and Rhythm: Normal rate and regular rhythm.     Heart sounds: No murmur. No friction rub. No gallop.   Pulmonary:     Breath sounds: No stridor. No wheezing or rales.  Chest:     Chest wall: No tenderness.  Abdominal:     General: There is no distension.     Tenderness: There is no abdominal tenderness. There is no rebound.  Musculoskeletal: Normal range of motion.  Lymphadenopathy:     Cervical: No cervical adenopathy.  Skin:    Findings: No erythema or rash.  Neurological:     Mental Status: He is oriented to person, place, and time.     Motor: No abnormal muscle tone.     Coordination: Coordination normal.  Psychiatric:        Behavior: Behavior normal.      ED Treatments / Results  Labs (all labs ordered are listed, but only abnormal results are displayed) Labs Reviewed  COMPREHENSIVE METABOLIC PANEL - Abnormal; Notable for the following components:      Result Value   Glucose, Bld 123 (*)    Calcium 8.7 (*)    All other components within normal limits  CBC WITH DIFFERENTIAL/PLATELET    EKG EKG Interpretation  Date/Time:  Sunday March 20 2019 20:15:17 EDT Ventricular Rate:  58 PR Interval:    QRS Duration: 117 QT Interval:  437 QTC Calculation: 430 R Axis:   62 Text Interpretation:  Junctional rhythm Nonspecific intraventricular conduction delay Borderline low voltage, extremity leads Confirmed by Milton Ferguson [phone]) on 03/20/2019  9:32:41 PM   Radiology Dg Chest 2 View  Result Date: 03/20/2019 CLINICAL DATA:  78 year old male with weakness, dizziness, nausea vomiting. EXAM: CHEST - 2 VIEW COMPARISON:  Chest radiographs 11/27/2014 and earlier. FINDINGS: Mildly lower lung volumes. Cardiac size is stable at the upper limits of normal. Other mediastinal contours are within normal limits. Visualized tracheal air column is within normal limits. No pneumothorax, pulmonary edema, pleural effusion or confluent pulmonary opacity. Chronic basilar predominant mild increased interstitial markings. No acute osseous abnormality identified. Negative visible bowel  gas pattern. IMPRESSION: No acute cardiopulmonary abnormality. Electronically Signed   By: Genevie Ann M.D.   On: 03/20/2019 20:52   Ct Head Wo Contrast  Result Date: 03/20/2019 CLINICAL DATA:  78 year old male with dizziness, nausea vomiting on set today. EXAM: CT HEAD WITHOUT CONTRAST TECHNIQUE: Contiguous axial images were obtained from the base of the skull through the vertex without intravenous contrast. COMPARISON:  None. FINDINGS: Brain: Cerebral volume is within normal limits for age. No midline shift, mass effect, or evidence of intracranial mass lesion. No ventriculomegaly. Gray-white matter differentiation is within normal limits for age. No cortically based acute infarct identified. No cortical encephalomalacia identified. No acute intracranial hemorrhage identified. Partially empty sella. Vascular: Calcified atherosclerosis at the skull base. Skull: Negative. Sinuses/Orbits: Visualized paranasal sinuses and mastoids are well pneumatized. Other: Postoperative changes to both globes. No acute orbit or scalp soft tissue finding. IMPRESSION: Negative for age non contrast CT appearance of the brain. Electronically Signed   By: Genevie Ann M.D.   On: 03/20/2019 20:50    Procedures Procedures (including critical care time)  Medications Ordered in ED Medications  sodium chloride 0.9 %  bolus 500 mL (0 mLs Intravenous Stopped 03/20/19 2129)  meclizine (ANTIVERT) tablet 25 mg (25 mg Oral Given 03/20/19 2035)     Initial Impression / Assessment and Plan / ED Course  I have reviewed the triage vital signs and the nursing notes.  Pertinent labs & imaging results that were available during my care of the patient were reviewed by me and considered in my medical decision making (see chart for details).        Labs unremarkable.  Patient improved with fluids and Antivert.  Dizziness could be related to vertigo.  He will be sent home with Zofran and Antivert and follow-up with PCP  Final Clinical Impressions(s) / ED Diagnoses   Final diagnoses:  Dizziness    ED Discharge Orders         Ordered    meclizine (ANTIVERT) 25 MG tablet  3 times daily PRN     03/20/19 2200    ondansetron (ZOFRAN ODT) 4 MG disintegrating tablet     03/20/19 2200           Milton Ferguson, MD 03/20/19 2204

## 2019-03-20 NOTE — Discharge Instructions (Addendum)
Drink plenty of fluids and follow-up with your primary care doctor this week return if any problems

## 2019-08-05 DIAGNOSIS — E039 Hypothyroidism, unspecified: Secondary | ICD-10-CM | POA: Diagnosis not present

## 2019-08-05 DIAGNOSIS — E785 Hyperlipidemia, unspecified: Secondary | ICD-10-CM | POA: Diagnosis not present

## 2019-08-05 DIAGNOSIS — R7301 Impaired fasting glucose: Secondary | ICD-10-CM | POA: Diagnosis not present

## 2019-08-05 DIAGNOSIS — I10 Essential (primary) hypertension: Secondary | ICD-10-CM | POA: Diagnosis not present

## 2019-08-10 DIAGNOSIS — Z23 Encounter for immunization: Secondary | ICD-10-CM | POA: Diagnosis not present

## 2019-08-10 DIAGNOSIS — R7301 Impaired fasting glucose: Secondary | ICD-10-CM | POA: Diagnosis not present

## 2019-08-10 DIAGNOSIS — E782 Mixed hyperlipidemia: Secondary | ICD-10-CM | POA: Diagnosis not present

## 2019-08-10 DIAGNOSIS — Z0000 Encounter for general adult medical examination without abnormal findings: Secondary | ICD-10-CM | POA: Diagnosis not present

## 2019-08-18 IMAGING — DX CHEST - 2 VIEW
2 series · 2 of 2 positions shown · non-contrast
Comparison: Chest radiographs 11/27/2014 and earlier.

CLINICAL DATA: 78-year-old male with weakness, dizziness, nausea
vomiting.

EXAM:
CHEST - 2 VIEW

[chest lat]
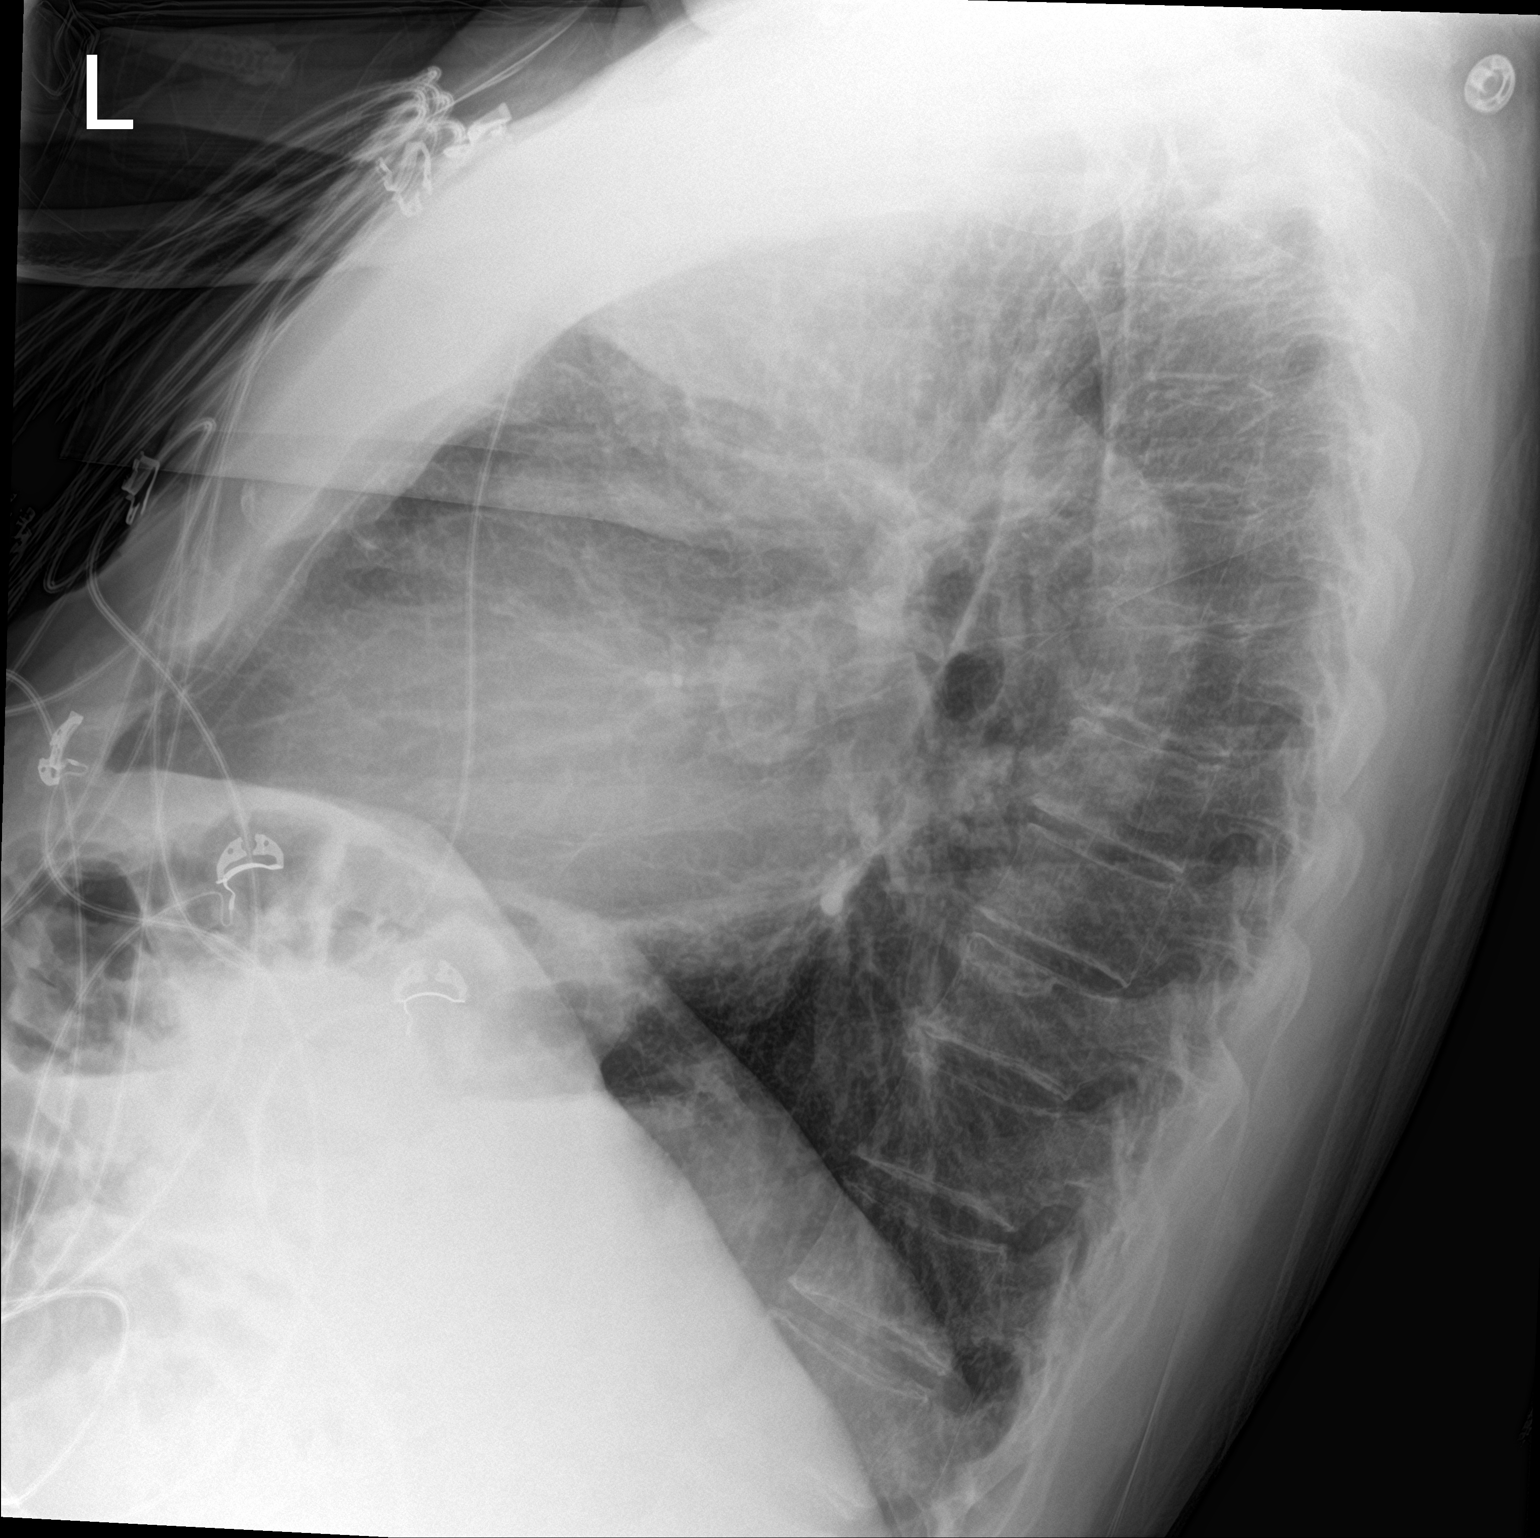

[chest ap]
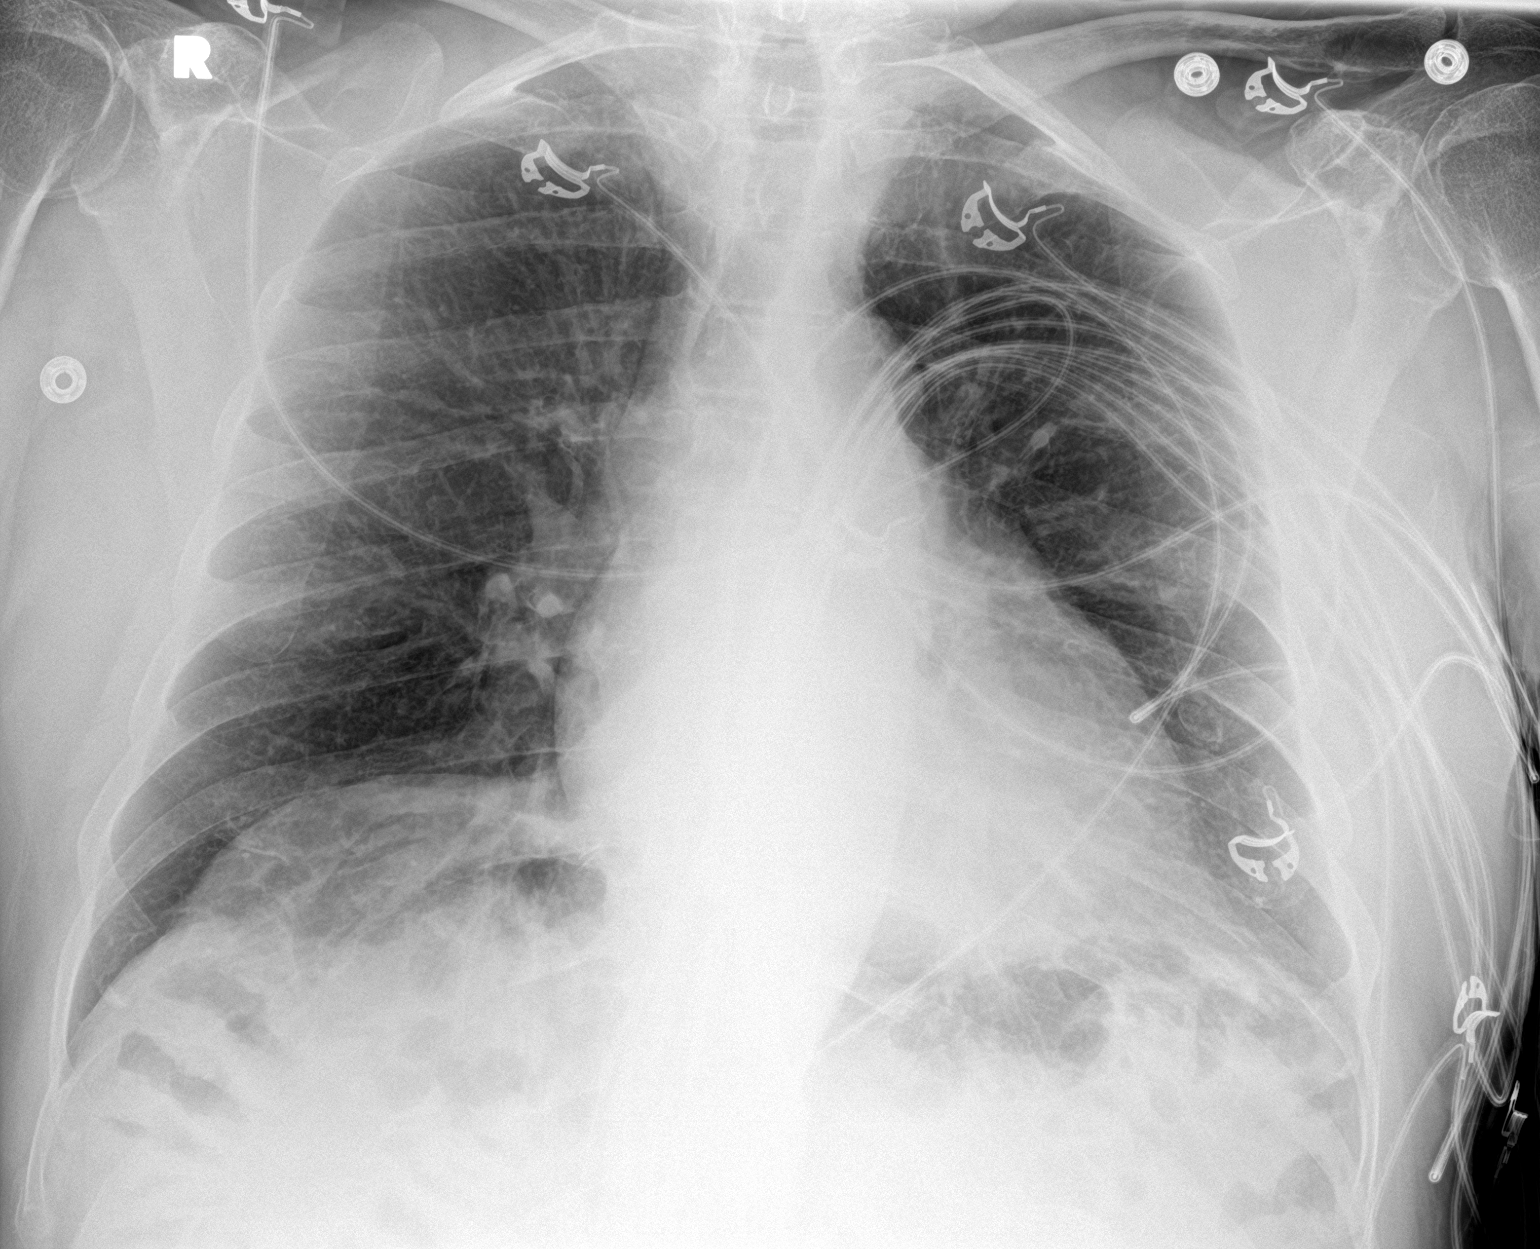

[2 of 2 positions shown; findings below may reference images not displayed]

FINDINGS: Mildly lower lung volumes. Cardiac size is stable at the upper
limits of normal. Other mediastinal contours are within normal
limits. Visualized tracheal air column is within normal limits. No
pneumothorax, pulmonary edema, pleural effusion or confluent
pulmonary opacity. Chronic basilar predominant mild increased
interstitial markings. No acute osseous abnormality identified.
Negative visible bowel gas pattern.
IMPRESSION: No acute cardiopulmonary abnormality.

## 2019-08-18 IMAGING — CT CT HEAD WITHOUT CONTRAST
3 series · 15 of 47 positions shown, 18 images · non-contrast
Comparison: None.

CLINICAL DATA: 78-year-old male with dizziness, nausea vomiting on
set today.

EXAM:
CT HEAD WITHOUT CONTRAST
TECHNIQUE: Contiguous axial images were obtained from the base of the skull
through the vertex without intravenous contrast.

[Series 2: head w o · axial · 0.47mm/px · z∈[-144,-4]mm · 9 of 34 slices shown, 12 images]
[im 3/34  brain]
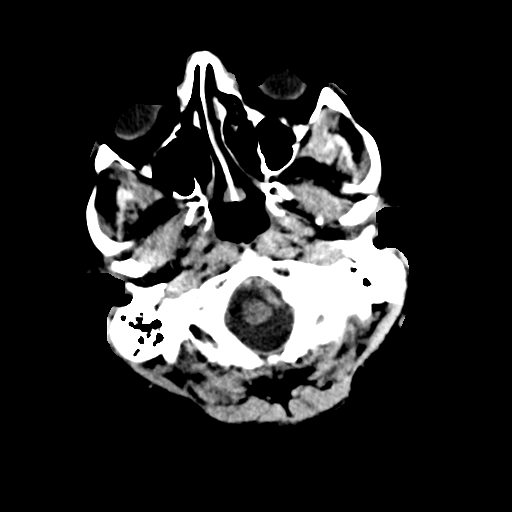
[im 3/34  bone]
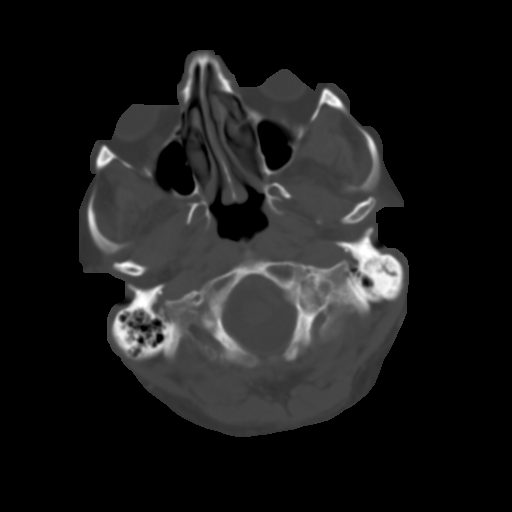
[im 6/34  brain]
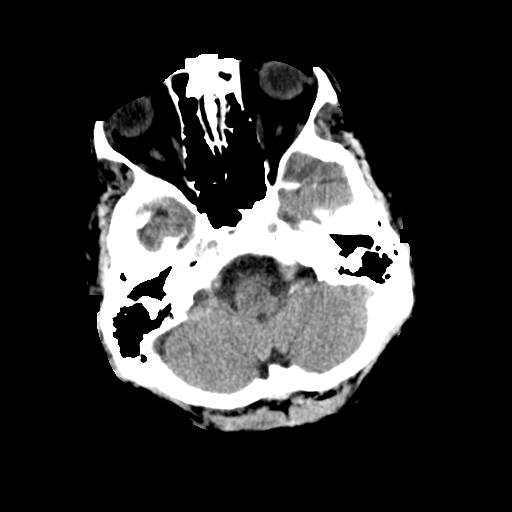
[im 10/34  brain]
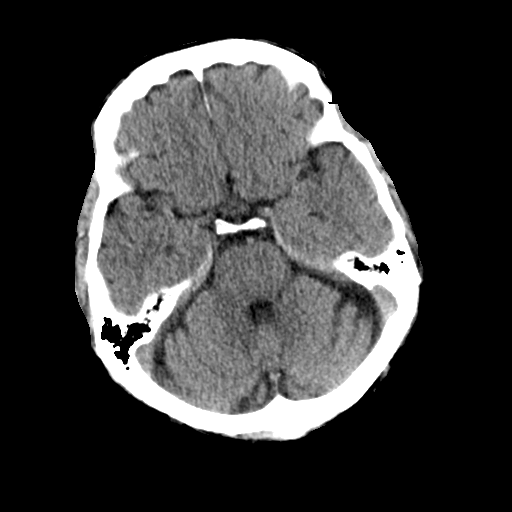
[im 13/34  brain]
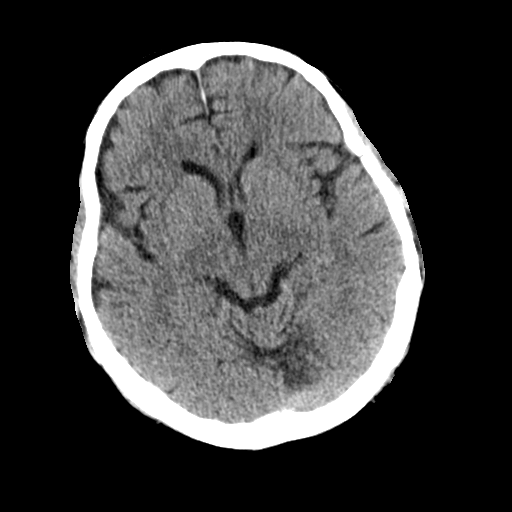
[im 18/34  brain]
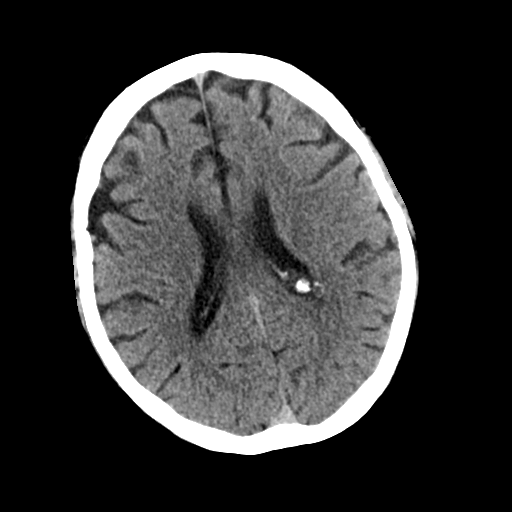
[im 18/34  bone]
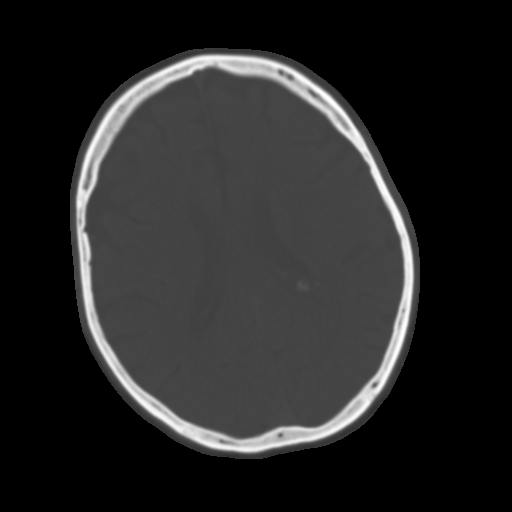
[im 21/34  brain]
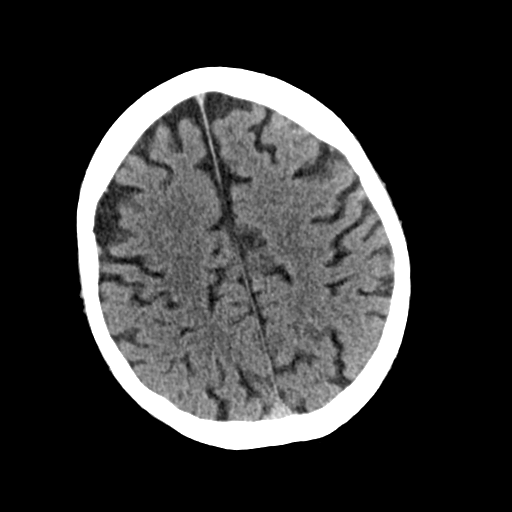
[im 24/34  brain]
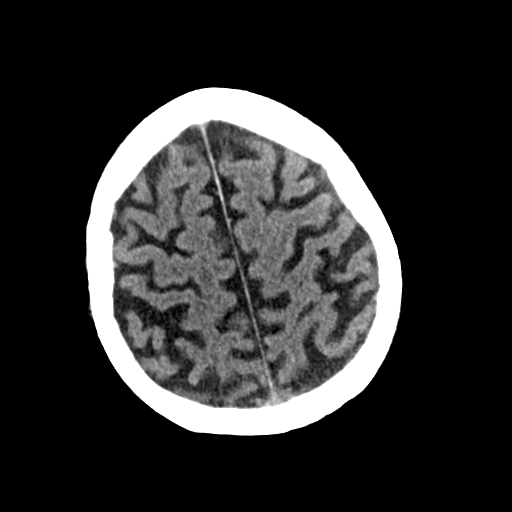
[im 28/34  brain]
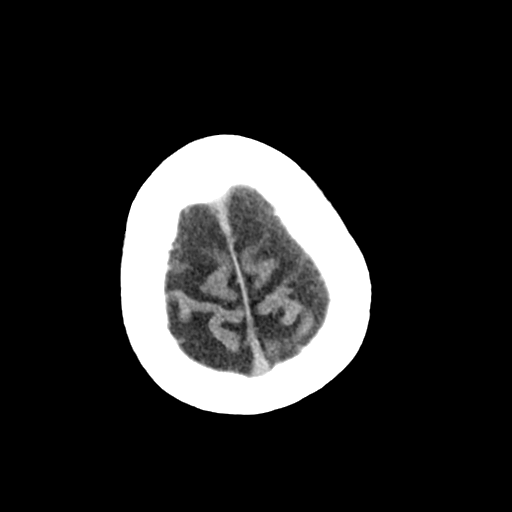
[im 31/34  brain]
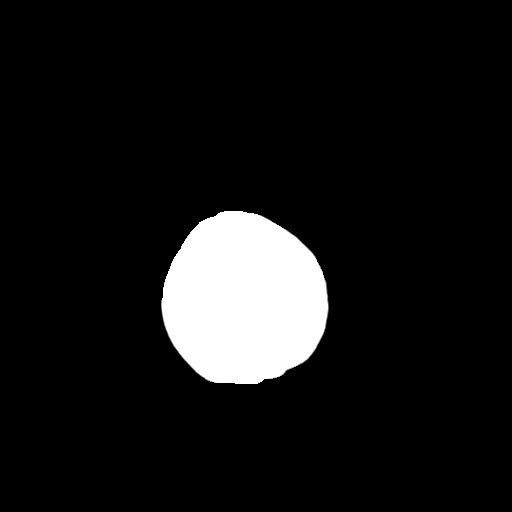
[im 31/34  bone]
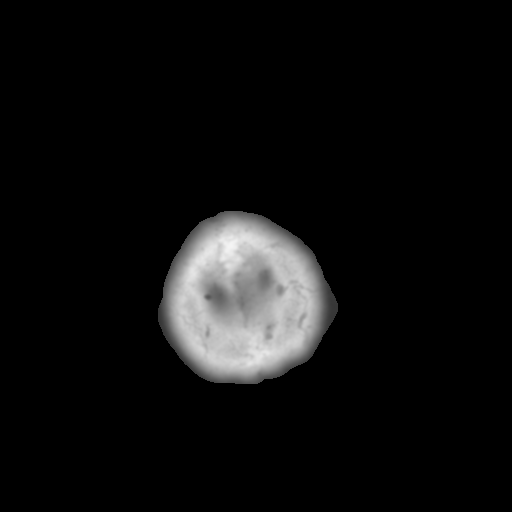

[Series 4: coronal soft · coronal · 0.35mm/px · 3 of 73 slices shown]
[im 25/73  brain]
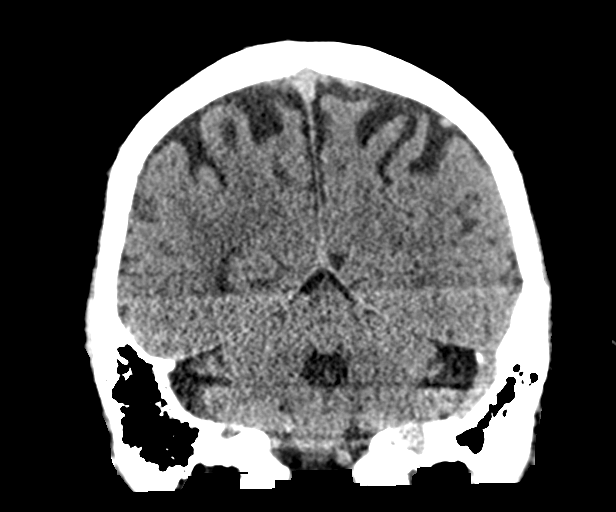
[im 33/73  brain]
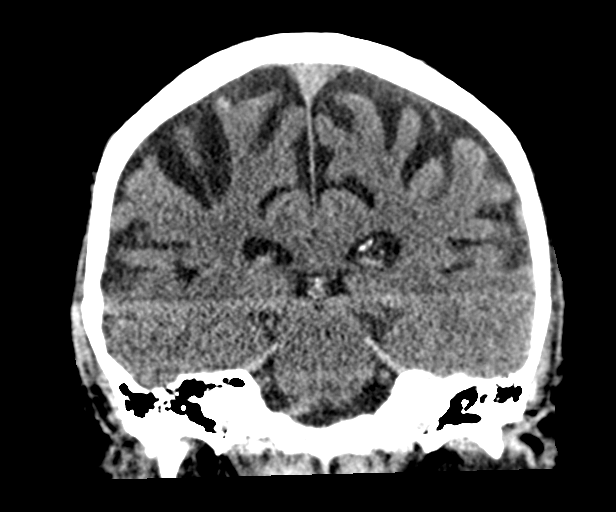
[im 41/73  brain]
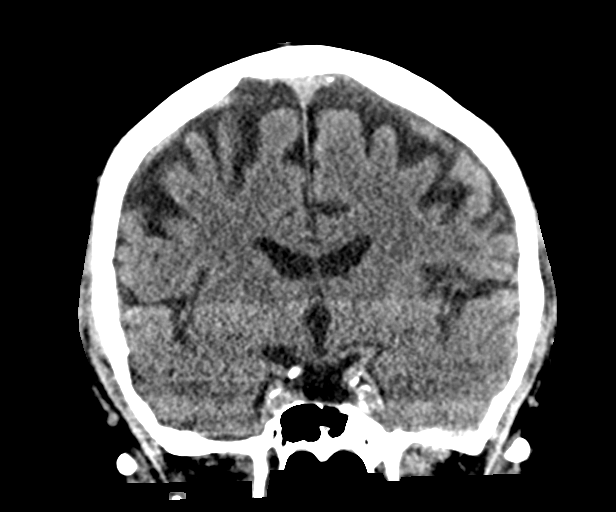

[Series 5: sagittal soft · sagittal · 0.35mm/px · 3 of 63 slices shown]
[im 21/63  brain]
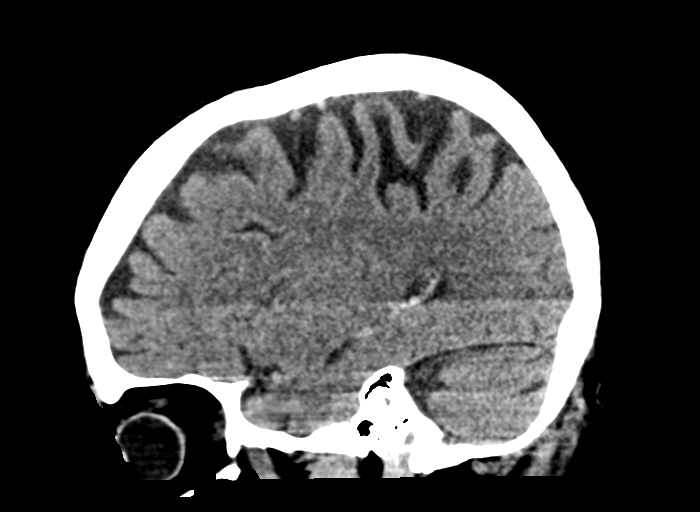
[im 32/63  brain]
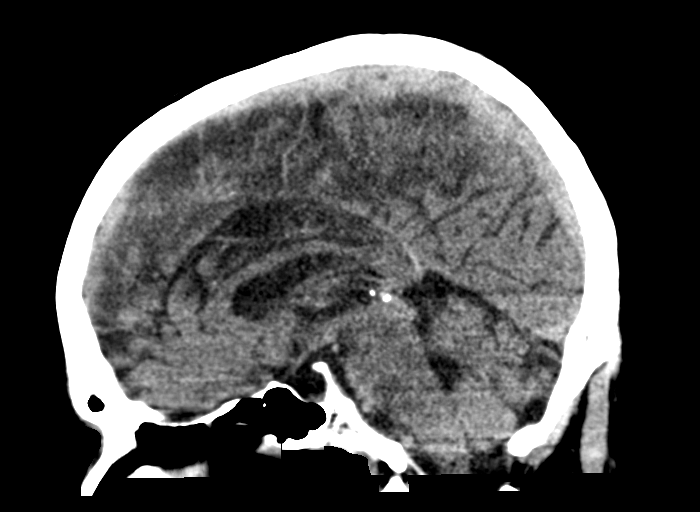
[im 42/63  brain]
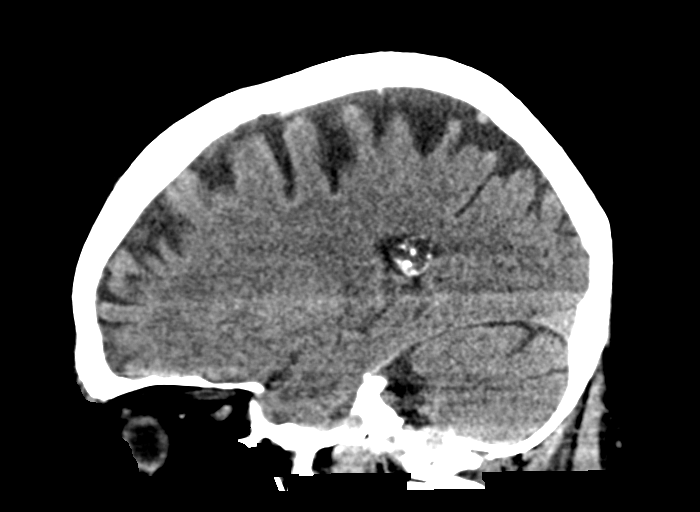

[15 of 47 positions shown; findings below may reference images not displayed]

FINDINGS: Brain: Cerebral volume is within normal limits for age. No midline
shift, mass effect, or evidence of intracranial mass lesion. No
ventriculomegaly. Gray-white matter differentiation is within normal
limits for age. No cortically based acute infarct identified. No
cortical encephalomalacia identified. No acute intracranial
hemorrhage identified. Partially empty sella.

Vascular: Calcified atherosclerosis at the skull base.

Skull: Negative.

Sinuses/Orbits: Visualized paranasal sinuses and mastoids are well
pneumatized.

Other: Postoperative changes to both globes. No acute orbit or scalp
soft tissue finding.
IMPRESSION: Negative for age non contrast CT appearance of the brain.

## 2019-09-30 DIAGNOSIS — M5441 Lumbago with sciatica, right side: Secondary | ICD-10-CM | POA: Diagnosis not present

## 2019-09-30 DIAGNOSIS — M9903 Segmental and somatic dysfunction of lumbar region: Secondary | ICD-10-CM | POA: Diagnosis not present

## 2019-09-30 DIAGNOSIS — M9905 Segmental and somatic dysfunction of pelvic region: Secondary | ICD-10-CM | POA: Diagnosis not present

## 2019-09-30 DIAGNOSIS — M9902 Segmental and somatic dysfunction of thoracic region: Secondary | ICD-10-CM | POA: Diagnosis not present

## 2019-10-03 DIAGNOSIS — M9903 Segmental and somatic dysfunction of lumbar region: Secondary | ICD-10-CM | POA: Diagnosis not present

## 2019-10-03 DIAGNOSIS — M9902 Segmental and somatic dysfunction of thoracic region: Secondary | ICD-10-CM | POA: Diagnosis not present

## 2019-10-03 DIAGNOSIS — M5441 Lumbago with sciatica, right side: Secondary | ICD-10-CM | POA: Diagnosis not present

## 2019-10-03 DIAGNOSIS — M9905 Segmental and somatic dysfunction of pelvic region: Secondary | ICD-10-CM | POA: Diagnosis not present

## 2019-10-04 DIAGNOSIS — M5441 Lumbago with sciatica, right side: Secondary | ICD-10-CM | POA: Diagnosis not present

## 2019-10-04 DIAGNOSIS — M9903 Segmental and somatic dysfunction of lumbar region: Secondary | ICD-10-CM | POA: Diagnosis not present

## 2019-10-04 DIAGNOSIS — M9905 Segmental and somatic dysfunction of pelvic region: Secondary | ICD-10-CM | POA: Diagnosis not present

## 2019-10-04 DIAGNOSIS — M9902 Segmental and somatic dysfunction of thoracic region: Secondary | ICD-10-CM | POA: Diagnosis not present

## 2019-10-05 DIAGNOSIS — M5441 Lumbago with sciatica, right side: Secondary | ICD-10-CM | POA: Diagnosis not present

## 2019-10-05 DIAGNOSIS — M9903 Segmental and somatic dysfunction of lumbar region: Secondary | ICD-10-CM | POA: Diagnosis not present

## 2019-10-05 DIAGNOSIS — M9905 Segmental and somatic dysfunction of pelvic region: Secondary | ICD-10-CM | POA: Diagnosis not present

## 2019-10-05 DIAGNOSIS — M9902 Segmental and somatic dysfunction of thoracic region: Secondary | ICD-10-CM | POA: Diagnosis not present

## 2019-10-10 DIAGNOSIS — M9903 Segmental and somatic dysfunction of lumbar region: Secondary | ICD-10-CM | POA: Diagnosis not present

## 2019-10-10 DIAGNOSIS — M9902 Segmental and somatic dysfunction of thoracic region: Secondary | ICD-10-CM | POA: Diagnosis not present

## 2019-10-10 DIAGNOSIS — M5441 Lumbago with sciatica, right side: Secondary | ICD-10-CM | POA: Diagnosis not present

## 2019-10-10 DIAGNOSIS — M9905 Segmental and somatic dysfunction of pelvic region: Secondary | ICD-10-CM | POA: Diagnosis not present

## 2019-10-12 ENCOUNTER — Other Ambulatory Visit: Payer: Self-pay

## 2019-10-12 ENCOUNTER — Ambulatory Visit: Payer: Medicare Other | Attending: Internal Medicine

## 2019-10-12 DIAGNOSIS — Z20822 Contact with and (suspected) exposure to covid-19: Secondary | ICD-10-CM

## 2019-10-12 DIAGNOSIS — Z20828 Contact with and (suspected) exposure to other viral communicable diseases: Secondary | ICD-10-CM | POA: Diagnosis not present

## 2019-10-14 LAB — NOVEL CORONAVIRUS, NAA: SARS-CoV-2, NAA: DETECTED — AB

## 2019-11-07 DIAGNOSIS — M5136 Other intervertebral disc degeneration, lumbar region: Secondary | ICD-10-CM | POA: Diagnosis not present

## 2019-11-07 DIAGNOSIS — M545 Low back pain: Secondary | ICD-10-CM | POA: Diagnosis not present

## 2019-11-14 DIAGNOSIS — M5410 Radiculopathy, site unspecified: Secondary | ICD-10-CM | POA: Diagnosis not present

## 2019-11-16 DIAGNOSIS — M545 Low back pain: Secondary | ICD-10-CM | POA: Diagnosis not present

## 2019-11-16 DIAGNOSIS — M5416 Radiculopathy, lumbar region: Secondary | ICD-10-CM | POA: Diagnosis not present

## 2019-11-16 DIAGNOSIS — M5136 Other intervertebral disc degeneration, lumbar region: Secondary | ICD-10-CM | POA: Diagnosis not present

## 2019-11-16 DIAGNOSIS — M47816 Spondylosis without myelopathy or radiculopathy, lumbar region: Secondary | ICD-10-CM | POA: Diagnosis not present

## 2019-11-21 DIAGNOSIS — M545 Low back pain: Secondary | ICD-10-CM | POA: Diagnosis not present

## 2019-11-21 DIAGNOSIS — M5410 Radiculopathy, site unspecified: Secondary | ICD-10-CM | POA: Diagnosis not present

## 2019-11-21 DIAGNOSIS — R269 Unspecified abnormalities of gait and mobility: Secondary | ICD-10-CM | POA: Diagnosis not present

## 2019-11-21 DIAGNOSIS — R262 Difficulty in walking, not elsewhere classified: Secondary | ICD-10-CM | POA: Diagnosis not present

## 2019-11-24 DIAGNOSIS — M5410 Radiculopathy, site unspecified: Secondary | ICD-10-CM | POA: Diagnosis not present

## 2019-11-24 DIAGNOSIS — R262 Difficulty in walking, not elsewhere classified: Secondary | ICD-10-CM | POA: Diagnosis not present

## 2019-11-24 DIAGNOSIS — R269 Unspecified abnormalities of gait and mobility: Secondary | ICD-10-CM | POA: Diagnosis not present

## 2019-11-24 DIAGNOSIS — M545 Low back pain: Secondary | ICD-10-CM | POA: Diagnosis not present

## 2019-11-29 DIAGNOSIS — R262 Difficulty in walking, not elsewhere classified: Secondary | ICD-10-CM | POA: Diagnosis not present

## 2019-11-29 DIAGNOSIS — R269 Unspecified abnormalities of gait and mobility: Secondary | ICD-10-CM | POA: Diagnosis not present

## 2019-11-29 DIAGNOSIS — M545 Low back pain: Secondary | ICD-10-CM | POA: Diagnosis not present

## 2019-11-29 DIAGNOSIS — M5410 Radiculopathy, site unspecified: Secondary | ICD-10-CM | POA: Diagnosis not present

## 2019-12-01 DIAGNOSIS — R262 Difficulty in walking, not elsewhere classified: Secondary | ICD-10-CM | POA: Diagnosis not present

## 2019-12-01 DIAGNOSIS — M5410 Radiculopathy, site unspecified: Secondary | ICD-10-CM | POA: Diagnosis not present

## 2019-12-01 DIAGNOSIS — R269 Unspecified abnormalities of gait and mobility: Secondary | ICD-10-CM | POA: Diagnosis not present

## 2019-12-01 DIAGNOSIS — M545 Low back pain: Secondary | ICD-10-CM | POA: Diagnosis not present

## 2019-12-19 DIAGNOSIS — E785 Hyperlipidemia, unspecified: Secondary | ICD-10-CM | POA: Diagnosis not present

## 2019-12-19 DIAGNOSIS — M5410 Radiculopathy, site unspecified: Secondary | ICD-10-CM | POA: Diagnosis not present

## 2019-12-19 DIAGNOSIS — I10 Essential (primary) hypertension: Secondary | ICD-10-CM | POA: Diagnosis not present

## 2019-12-19 DIAGNOSIS — R7301 Impaired fasting glucose: Secondary | ICD-10-CM | POA: Diagnosis not present

## 2019-12-19 DIAGNOSIS — E782 Mixed hyperlipidemia: Secondary | ICD-10-CM | POA: Diagnosis not present

## 2019-12-20 DIAGNOSIS — R262 Difficulty in walking, not elsewhere classified: Secondary | ICD-10-CM | POA: Diagnosis not present

## 2019-12-20 DIAGNOSIS — M5410 Radiculopathy, site unspecified: Secondary | ICD-10-CM | POA: Diagnosis not present

## 2019-12-20 DIAGNOSIS — R269 Unspecified abnormalities of gait and mobility: Secondary | ICD-10-CM | POA: Diagnosis not present

## 2019-12-20 DIAGNOSIS — M545 Low back pain: Secondary | ICD-10-CM | POA: Diagnosis not present

## 2019-12-22 DIAGNOSIS — M5410 Radiculopathy, site unspecified: Secondary | ICD-10-CM | POA: Diagnosis not present

## 2019-12-22 DIAGNOSIS — R269 Unspecified abnormalities of gait and mobility: Secondary | ICD-10-CM | POA: Diagnosis not present

## 2019-12-22 DIAGNOSIS — M545 Low back pain: Secondary | ICD-10-CM | POA: Diagnosis not present

## 2019-12-22 DIAGNOSIS — R262 Difficulty in walking, not elsewhere classified: Secondary | ICD-10-CM | POA: Diagnosis not present

## 2019-12-27 DIAGNOSIS — R269 Unspecified abnormalities of gait and mobility: Secondary | ICD-10-CM | POA: Diagnosis not present

## 2019-12-27 DIAGNOSIS — M545 Low back pain: Secondary | ICD-10-CM | POA: Diagnosis not present

## 2019-12-27 DIAGNOSIS — M5410 Radiculopathy, site unspecified: Secondary | ICD-10-CM | POA: Diagnosis not present

## 2019-12-27 DIAGNOSIS — R262 Difficulty in walking, not elsewhere classified: Secondary | ICD-10-CM | POA: Diagnosis not present

## 2019-12-28 DIAGNOSIS — M5410 Radiculopathy, site unspecified: Secondary | ICD-10-CM | POA: Diagnosis not present

## 2019-12-28 DIAGNOSIS — M48061 Spinal stenosis, lumbar region without neurogenic claudication: Secondary | ICD-10-CM | POA: Diagnosis not present

## 2019-12-28 DIAGNOSIS — M5126 Other intervertebral disc displacement, lumbar region: Secondary | ICD-10-CM | POA: Diagnosis not present

## 2019-12-29 DIAGNOSIS — R262 Difficulty in walking, not elsewhere classified: Secondary | ICD-10-CM | POA: Diagnosis not present

## 2019-12-29 DIAGNOSIS — R269 Unspecified abnormalities of gait and mobility: Secondary | ICD-10-CM | POA: Diagnosis not present

## 2019-12-29 DIAGNOSIS — M5410 Radiculopathy, site unspecified: Secondary | ICD-10-CM | POA: Diagnosis not present

## 2019-12-29 DIAGNOSIS — M545 Low back pain: Secondary | ICD-10-CM | POA: Diagnosis not present

## 2019-12-30 DIAGNOSIS — H524 Presbyopia: Secondary | ICD-10-CM | POA: Diagnosis not present

## 2020-01-03 DIAGNOSIS — R262 Difficulty in walking, not elsewhere classified: Secondary | ICD-10-CM | POA: Diagnosis not present

## 2020-01-03 DIAGNOSIS — R269 Unspecified abnormalities of gait and mobility: Secondary | ICD-10-CM | POA: Diagnosis not present

## 2020-01-03 DIAGNOSIS — M545 Low back pain: Secondary | ICD-10-CM | POA: Diagnosis not present

## 2020-01-03 DIAGNOSIS — M5410 Radiculopathy, site unspecified: Secondary | ICD-10-CM | POA: Diagnosis not present

## 2020-01-05 DIAGNOSIS — R269 Unspecified abnormalities of gait and mobility: Secondary | ICD-10-CM | POA: Diagnosis not present

## 2020-01-05 DIAGNOSIS — M5410 Radiculopathy, site unspecified: Secondary | ICD-10-CM | POA: Diagnosis not present

## 2020-01-05 DIAGNOSIS — M545 Low back pain: Secondary | ICD-10-CM | POA: Diagnosis not present

## 2020-01-05 DIAGNOSIS — R262 Difficulty in walking, not elsewhere classified: Secondary | ICD-10-CM | POA: Diagnosis not present

## 2020-01-10 DIAGNOSIS — M545 Low back pain: Secondary | ICD-10-CM | POA: Diagnosis not present

## 2020-01-10 DIAGNOSIS — M5410 Radiculopathy, site unspecified: Secondary | ICD-10-CM | POA: Diagnosis not present

## 2020-01-10 DIAGNOSIS — R269 Unspecified abnormalities of gait and mobility: Secondary | ICD-10-CM | POA: Diagnosis not present

## 2020-01-10 DIAGNOSIS — R262 Difficulty in walking, not elsewhere classified: Secondary | ICD-10-CM | POA: Diagnosis not present

## 2020-01-12 DIAGNOSIS — R262 Difficulty in walking, not elsewhere classified: Secondary | ICD-10-CM | POA: Diagnosis not present

## 2020-01-12 DIAGNOSIS — M545 Low back pain: Secondary | ICD-10-CM | POA: Diagnosis not present

## 2020-01-12 DIAGNOSIS — R269 Unspecified abnormalities of gait and mobility: Secondary | ICD-10-CM | POA: Diagnosis not present

## 2020-01-12 DIAGNOSIS — M5410 Radiculopathy, site unspecified: Secondary | ICD-10-CM | POA: Diagnosis not present

## 2020-01-16 DIAGNOSIS — M5410 Radiculopathy, site unspecified: Secondary | ICD-10-CM | POA: Diagnosis not present

## 2020-02-06 DIAGNOSIS — E039 Hypothyroidism, unspecified: Secondary | ICD-10-CM | POA: Diagnosis not present

## 2020-02-06 DIAGNOSIS — E785 Hyperlipidemia, unspecified: Secondary | ICD-10-CM | POA: Diagnosis not present

## 2020-02-06 DIAGNOSIS — E782 Mixed hyperlipidemia: Secondary | ICD-10-CM | POA: Diagnosis not present

## 2020-02-06 DIAGNOSIS — I10 Essential (primary) hypertension: Secondary | ICD-10-CM | POA: Diagnosis not present

## 2020-02-09 DIAGNOSIS — E039 Hypothyroidism, unspecified: Secondary | ICD-10-CM | POA: Diagnosis not present

## 2020-02-09 DIAGNOSIS — R7301 Impaired fasting glucose: Secondary | ICD-10-CM | POA: Diagnosis not present

## 2020-02-09 DIAGNOSIS — Z0000 Encounter for general adult medical examination without abnormal findings: Secondary | ICD-10-CM | POA: Diagnosis not present

## 2020-02-09 DIAGNOSIS — E782 Mixed hyperlipidemia: Secondary | ICD-10-CM | POA: Diagnosis not present

## 2020-02-14 DIAGNOSIS — Z0120 Encounter for dental examination and cleaning without abnormal findings: Secondary | ICD-10-CM | POA: Diagnosis not present

## 2020-03-28 DIAGNOSIS — D225 Melanocytic nevi of trunk: Secondary | ICD-10-CM | POA: Diagnosis not present

## 2020-03-28 DIAGNOSIS — L570 Actinic keratosis: Secondary | ICD-10-CM | POA: Diagnosis not present

## 2020-03-28 DIAGNOSIS — X32XXXD Exposure to sunlight, subsequent encounter: Secondary | ICD-10-CM | POA: Diagnosis not present

## 2020-03-28 DIAGNOSIS — Z1283 Encounter for screening for malignant neoplasm of skin: Secondary | ICD-10-CM | POA: Diagnosis not present

## 2020-05-09 DIAGNOSIS — E782 Mixed hyperlipidemia: Secondary | ICD-10-CM | POA: Diagnosis not present

## 2020-05-09 DIAGNOSIS — E039 Hypothyroidism, unspecified: Secondary | ICD-10-CM | POA: Diagnosis not present

## 2020-05-09 DIAGNOSIS — I10 Essential (primary) hypertension: Secondary | ICD-10-CM | POA: Diagnosis not present

## 2020-05-09 DIAGNOSIS — E785 Hyperlipidemia, unspecified: Secondary | ICD-10-CM | POA: Diagnosis not present

## 2020-07-12 DIAGNOSIS — E039 Hypothyroidism, unspecified: Secondary | ICD-10-CM | POA: Diagnosis not present

## 2020-07-12 DIAGNOSIS — I10 Essential (primary) hypertension: Secondary | ICD-10-CM | POA: Diagnosis not present

## 2020-07-12 DIAGNOSIS — E785 Hyperlipidemia, unspecified: Secondary | ICD-10-CM | POA: Diagnosis not present

## 2020-07-12 DIAGNOSIS — E782 Mixed hyperlipidemia: Secondary | ICD-10-CM | POA: Diagnosis not present

## 2020-08-02 DIAGNOSIS — E782 Mixed hyperlipidemia: Secondary | ICD-10-CM | POA: Diagnosis not present

## 2020-08-02 DIAGNOSIS — E785 Hyperlipidemia, unspecified: Secondary | ICD-10-CM | POA: Diagnosis not present

## 2020-08-02 DIAGNOSIS — I10 Essential (primary) hypertension: Secondary | ICD-10-CM | POA: Diagnosis not present

## 2020-08-02 DIAGNOSIS — E039 Hypothyroidism, unspecified: Secondary | ICD-10-CM | POA: Diagnosis not present

## 2020-08-13 DIAGNOSIS — Z23 Encounter for immunization: Secondary | ICD-10-CM | POA: Diagnosis not present

## 2020-08-13 DIAGNOSIS — K649 Unspecified hemorrhoids: Secondary | ICD-10-CM | POA: Diagnosis not present

## 2020-08-13 DIAGNOSIS — Z6829 Body mass index (BMI) 29.0-29.9, adult: Secondary | ICD-10-CM | POA: Diagnosis not present

## 2020-08-13 DIAGNOSIS — Z712 Person consulting for explanation of examination or test findings: Secondary | ICD-10-CM | POA: Diagnosis not present

## 2020-08-22 DIAGNOSIS — Z23 Encounter for immunization: Secondary | ICD-10-CM | POA: Diagnosis not present

## 2020-08-22 DIAGNOSIS — E782 Mixed hyperlipidemia: Secondary | ICD-10-CM | POA: Diagnosis not present

## 2020-08-22 DIAGNOSIS — E039 Hypothyroidism, unspecified: Secondary | ICD-10-CM | POA: Diagnosis not present

## 2020-08-22 DIAGNOSIS — R7301 Impaired fasting glucose: Secondary | ICD-10-CM | POA: Diagnosis not present

## 2020-09-12 DIAGNOSIS — Z0120 Encounter for dental examination and cleaning without abnormal findings: Secondary | ICD-10-CM | POA: Diagnosis not present

## 2020-09-18 DIAGNOSIS — R42 Dizziness and giddiness: Secondary | ICD-10-CM | POA: Diagnosis not present

## 2020-11-14 DIAGNOSIS — M9905 Segmental and somatic dysfunction of pelvic region: Secondary | ICD-10-CM | POA: Diagnosis not present

## 2020-11-14 DIAGNOSIS — M9903 Segmental and somatic dysfunction of lumbar region: Secondary | ICD-10-CM | POA: Diagnosis not present

## 2020-11-14 DIAGNOSIS — M9902 Segmental and somatic dysfunction of thoracic region: Secondary | ICD-10-CM | POA: Diagnosis not present

## 2020-11-14 DIAGNOSIS — M5441 Lumbago with sciatica, right side: Secondary | ICD-10-CM | POA: Diagnosis not present

## 2020-11-16 DIAGNOSIS — M9903 Segmental and somatic dysfunction of lumbar region: Secondary | ICD-10-CM | POA: Diagnosis not present

## 2020-11-16 DIAGNOSIS — M9905 Segmental and somatic dysfunction of pelvic region: Secondary | ICD-10-CM | POA: Diagnosis not present

## 2020-11-16 DIAGNOSIS — M5441 Lumbago with sciatica, right side: Secondary | ICD-10-CM | POA: Diagnosis not present

## 2020-11-16 DIAGNOSIS — M9902 Segmental and somatic dysfunction of thoracic region: Secondary | ICD-10-CM | POA: Diagnosis not present

## 2020-11-19 DIAGNOSIS — M9905 Segmental and somatic dysfunction of pelvic region: Secondary | ICD-10-CM | POA: Diagnosis not present

## 2020-11-19 DIAGNOSIS — M9902 Segmental and somatic dysfunction of thoracic region: Secondary | ICD-10-CM | POA: Diagnosis not present

## 2020-11-19 DIAGNOSIS — M5441 Lumbago with sciatica, right side: Secondary | ICD-10-CM | POA: Diagnosis not present

## 2020-11-19 DIAGNOSIS — M9903 Segmental and somatic dysfunction of lumbar region: Secondary | ICD-10-CM | POA: Diagnosis not present

## 2020-11-21 DIAGNOSIS — M5441 Lumbago with sciatica, right side: Secondary | ICD-10-CM | POA: Diagnosis not present

## 2020-11-21 DIAGNOSIS — M9905 Segmental and somatic dysfunction of pelvic region: Secondary | ICD-10-CM | POA: Diagnosis not present

## 2020-11-21 DIAGNOSIS — M9902 Segmental and somatic dysfunction of thoracic region: Secondary | ICD-10-CM | POA: Diagnosis not present

## 2020-11-21 DIAGNOSIS — M9903 Segmental and somatic dysfunction of lumbar region: Secondary | ICD-10-CM | POA: Diagnosis not present

## 2020-11-26 DIAGNOSIS — M9902 Segmental and somatic dysfunction of thoracic region: Secondary | ICD-10-CM | POA: Diagnosis not present

## 2020-11-26 DIAGNOSIS — M9903 Segmental and somatic dysfunction of lumbar region: Secondary | ICD-10-CM | POA: Diagnosis not present

## 2020-11-26 DIAGNOSIS — M5441 Lumbago with sciatica, right side: Secondary | ICD-10-CM | POA: Diagnosis not present

## 2020-11-26 DIAGNOSIS — M9905 Segmental and somatic dysfunction of pelvic region: Secondary | ICD-10-CM | POA: Diagnosis not present

## 2020-11-28 DIAGNOSIS — M9905 Segmental and somatic dysfunction of pelvic region: Secondary | ICD-10-CM | POA: Diagnosis not present

## 2020-11-28 DIAGNOSIS — M5441 Lumbago with sciatica, right side: Secondary | ICD-10-CM | POA: Diagnosis not present

## 2020-11-28 DIAGNOSIS — M9902 Segmental and somatic dysfunction of thoracic region: Secondary | ICD-10-CM | POA: Diagnosis not present

## 2020-11-28 DIAGNOSIS — M9903 Segmental and somatic dysfunction of lumbar region: Secondary | ICD-10-CM | POA: Diagnosis not present

## 2020-12-03 DIAGNOSIS — M9903 Segmental and somatic dysfunction of lumbar region: Secondary | ICD-10-CM | POA: Diagnosis not present

## 2020-12-03 DIAGNOSIS — M9902 Segmental and somatic dysfunction of thoracic region: Secondary | ICD-10-CM | POA: Diagnosis not present

## 2020-12-03 DIAGNOSIS — M9905 Segmental and somatic dysfunction of pelvic region: Secondary | ICD-10-CM | POA: Diagnosis not present

## 2020-12-03 DIAGNOSIS — M5441 Lumbago with sciatica, right side: Secondary | ICD-10-CM | POA: Diagnosis not present

## 2020-12-05 DIAGNOSIS — M9902 Segmental and somatic dysfunction of thoracic region: Secondary | ICD-10-CM | POA: Diagnosis not present

## 2020-12-05 DIAGNOSIS — M5441 Lumbago with sciatica, right side: Secondary | ICD-10-CM | POA: Diagnosis not present

## 2020-12-05 DIAGNOSIS — M9905 Segmental and somatic dysfunction of pelvic region: Secondary | ICD-10-CM | POA: Diagnosis not present

## 2020-12-05 DIAGNOSIS — M9903 Segmental and somatic dysfunction of lumbar region: Secondary | ICD-10-CM | POA: Diagnosis not present

## 2020-12-12 DIAGNOSIS — M9902 Segmental and somatic dysfunction of thoracic region: Secondary | ICD-10-CM | POA: Diagnosis not present

## 2020-12-12 DIAGNOSIS — M9905 Segmental and somatic dysfunction of pelvic region: Secondary | ICD-10-CM | POA: Diagnosis not present

## 2020-12-12 DIAGNOSIS — M5441 Lumbago with sciatica, right side: Secondary | ICD-10-CM | POA: Diagnosis not present

## 2020-12-12 DIAGNOSIS — M9903 Segmental and somatic dysfunction of lumbar region: Secondary | ICD-10-CM | POA: Diagnosis not present

## 2021-01-01 ENCOUNTER — Other Ambulatory Visit (HOSPITAL_COMMUNITY): Payer: Self-pay | Admitting: Neurosurgery

## 2021-01-01 ENCOUNTER — Other Ambulatory Visit: Payer: Self-pay | Admitting: Neurosurgery

## 2021-01-01 DIAGNOSIS — M5416 Radiculopathy, lumbar region: Secondary | ICD-10-CM

## 2021-01-09 DIAGNOSIS — E782 Mixed hyperlipidemia: Secondary | ICD-10-CM | POA: Diagnosis not present

## 2021-01-09 DIAGNOSIS — R7301 Impaired fasting glucose: Secondary | ICD-10-CM | POA: Diagnosis not present

## 2021-01-09 DIAGNOSIS — I10 Essential (primary) hypertension: Secondary | ICD-10-CM | POA: Diagnosis not present

## 2021-01-09 DIAGNOSIS — E785 Hyperlipidemia, unspecified: Secondary | ICD-10-CM | POA: Diagnosis not present

## 2021-01-17 ENCOUNTER — Ambulatory Visit (HOSPITAL_COMMUNITY)
Admission: RE | Admit: 2021-01-17 | Discharge: 2021-01-17 | Disposition: A | Discharge status: Home / Self Care | Payer: Medicare Other | Source: Ambulatory Visit | Attending: Neurosurgery | Admitting: Neurosurgery

## 2021-01-17 DIAGNOSIS — M545 Low back pain, unspecified: Secondary | ICD-10-CM | POA: Diagnosis not present

## 2021-01-17 DIAGNOSIS — M5416 Radiculopathy, lumbar region: Secondary | ICD-10-CM | POA: Insufficient documentation

## 2021-01-21 DIAGNOSIS — M5416 Radiculopathy, lumbar region: Secondary | ICD-10-CM | POA: Diagnosis not present

## 2021-01-21 DIAGNOSIS — M5410 Radiculopathy, site unspecified: Secondary | ICD-10-CM | POA: Diagnosis not present

## 2021-02-21 DIAGNOSIS — E785 Hyperlipidemia, unspecified: Secondary | ICD-10-CM | POA: Diagnosis not present

## 2021-02-21 DIAGNOSIS — Z0000 Encounter for general adult medical examination without abnormal findings: Secondary | ICD-10-CM | POA: Diagnosis not present

## 2021-02-21 DIAGNOSIS — I10 Essential (primary) hypertension: Secondary | ICD-10-CM | POA: Diagnosis not present

## 2021-02-21 DIAGNOSIS — E039 Hypothyroidism, unspecified: Secondary | ICD-10-CM | POA: Diagnosis not present

## 2021-02-27 DIAGNOSIS — R7301 Impaired fasting glucose: Secondary | ICD-10-CM | POA: Diagnosis not present

## 2021-02-27 DIAGNOSIS — M48061 Spinal stenosis, lumbar region without neurogenic claudication: Secondary | ICD-10-CM | POA: Diagnosis not present

## 2021-02-27 DIAGNOSIS — E039 Hypothyroidism, unspecified: Secondary | ICD-10-CM | POA: Diagnosis not present

## 2021-02-27 DIAGNOSIS — E782 Mixed hyperlipidemia: Secondary | ICD-10-CM | POA: Diagnosis not present

## 2021-03-20 DIAGNOSIS — H524 Presbyopia: Secondary | ICD-10-CM | POA: Diagnosis not present

## 2021-04-08 DIAGNOSIS — E782 Mixed hyperlipidemia: Secondary | ICD-10-CM | POA: Diagnosis not present

## 2021-04-08 DIAGNOSIS — R7301 Impaired fasting glucose: Secondary | ICD-10-CM | POA: Diagnosis not present

## 2021-04-08 DIAGNOSIS — I10 Essential (primary) hypertension: Secondary | ICD-10-CM | POA: Diagnosis not present

## 2021-04-08 DIAGNOSIS — E039 Hypothyroidism, unspecified: Secondary | ICD-10-CM | POA: Diagnosis not present

## 2021-04-11 DIAGNOSIS — R42 Dizziness and giddiness: Secondary | ICD-10-CM | POA: Diagnosis not present

## 2021-04-11 DIAGNOSIS — E039 Hypothyroidism, unspecified: Secondary | ICD-10-CM | POA: Diagnosis not present

## 2021-04-11 DIAGNOSIS — R7301 Impaired fasting glucose: Secondary | ICD-10-CM | POA: Diagnosis not present

## 2021-06-12 DIAGNOSIS — I10 Essential (primary) hypertension: Secondary | ICD-10-CM | POA: Diagnosis not present

## 2021-06-12 DIAGNOSIS — E782 Mixed hyperlipidemia: Secondary | ICD-10-CM | POA: Diagnosis not present

## 2021-06-17 IMAGING — MR MR LUMBAR SPINE W/O CM
4 of 5 series · 30 of 48 positions shown · non-contrast
Comparison: 12/28/2019

CLINICAL DATA: Low back pain radiating to the left leg over the
last 3 weeks.

EXAM:
MRI LUMBAR SPINE WITHOUT CONTRAST
TECHNIQUE: Multiplanar, multisequence MR imaging of the lumbar spine was
performed. No intravenous contrast was administered.

[Series 5: T2 · sagittal · 4.0mm · 0.68mm/px · 8 of 16 slices shown (1 of 2)]
[im 1/16]
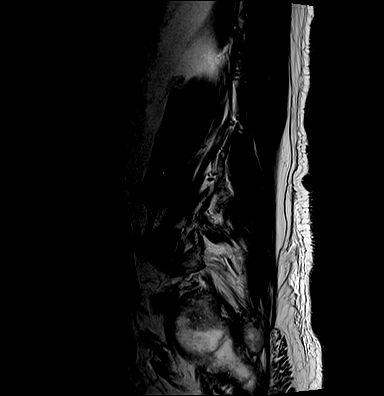
[im 3/16]
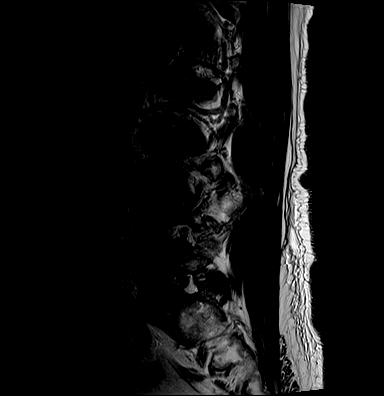
[im 5/16]
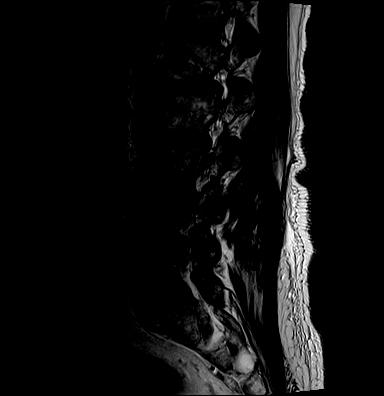
[im 7/16]
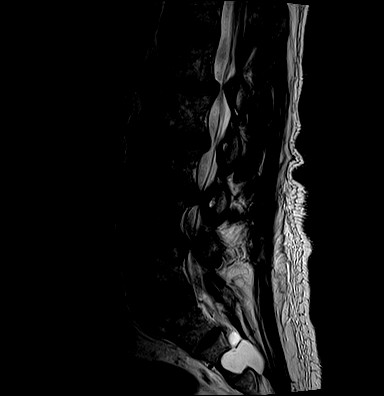
[im 9/16]
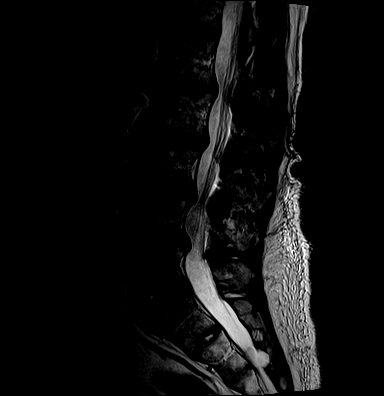
[im 11/16]
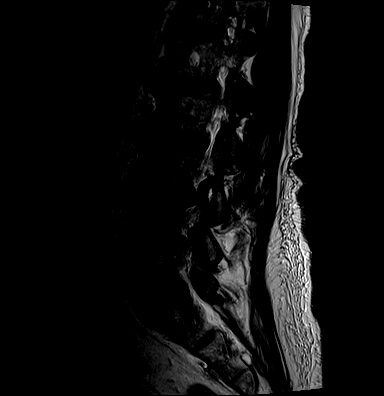
[im 13/16]
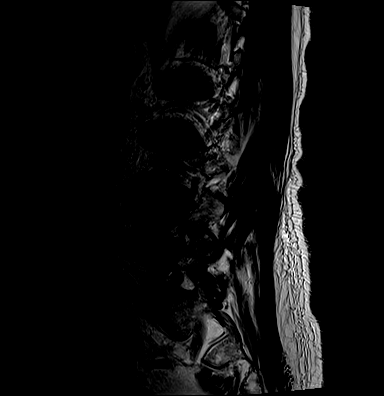
[im 16/16]
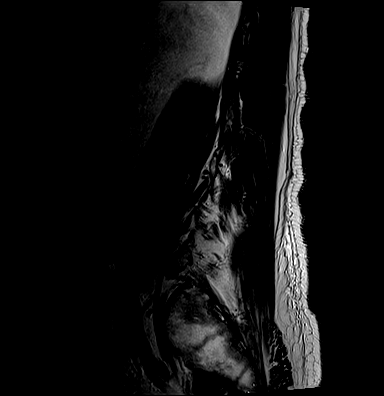

[Series 6: T1 · sagittal · 4.0mm · 0.81mm/px · 6 of 15 slices shown (1 of 2)]
[im 1/15]
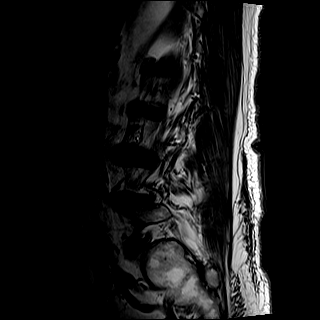
[im 3/15]
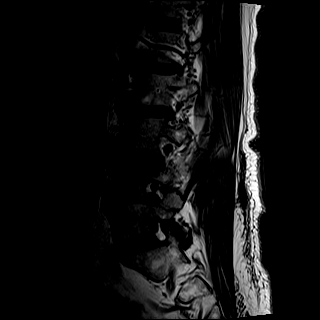
[im 6/15]
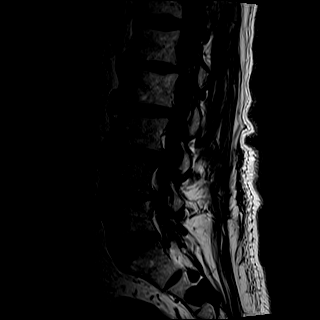
[im 9/15]
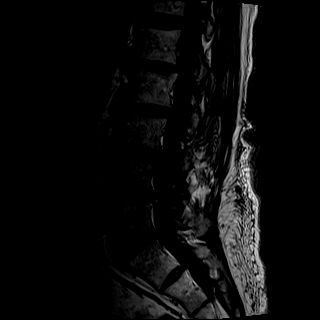
[im 12/15]
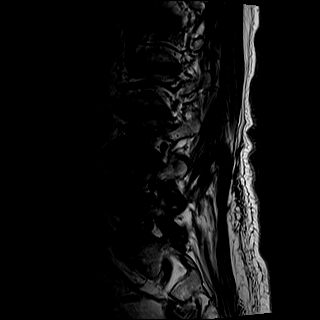
[im 15/15]
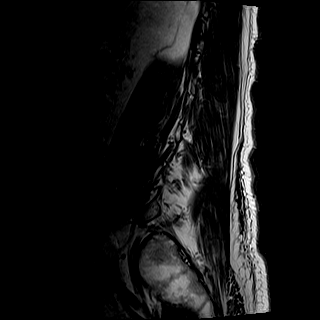

[Series 8: T2 · axial · 4.0mm · 0.70mm/px · z∈[-177,+11]mm · 8 of 33 slices shown (2 of 2)]
[im 1/33]
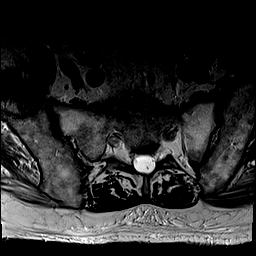
[im 5/33]
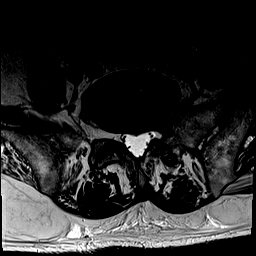
[im 10/33]
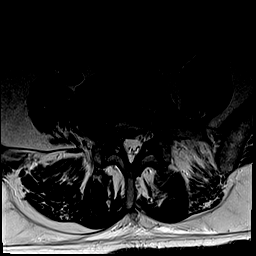
[im 15/33]
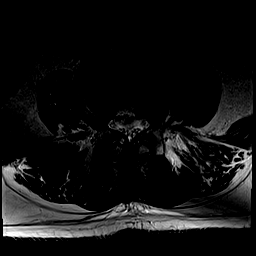
[im 18/33]
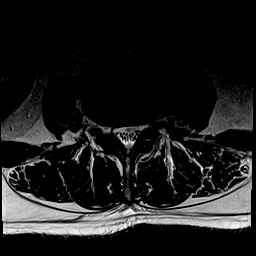
[im 23/33]
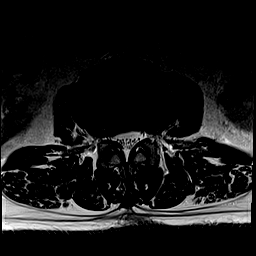
[im 28/33]
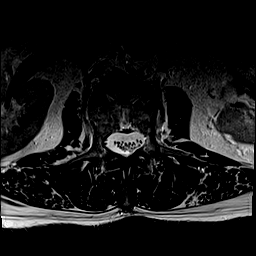
[im 33/33]
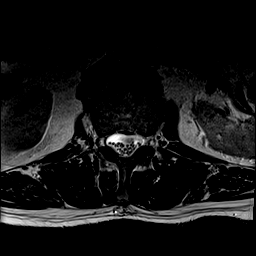

[Series 9: T1 · axial · 4.0mm · 0.35mm/px · z∈[-177,+11]mm · 8 of 33 slices shown (2 of 2)]
[im 1/33]
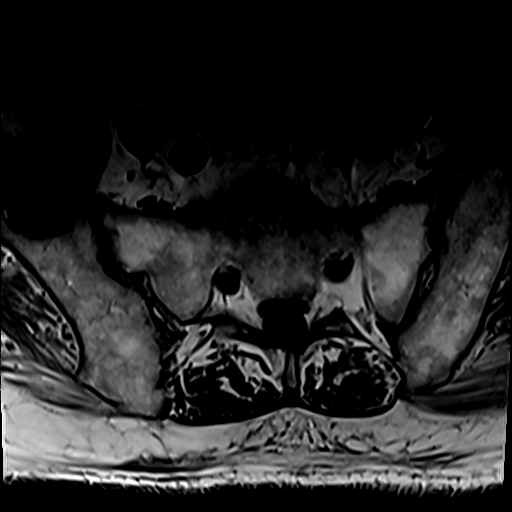
[im 5/33]
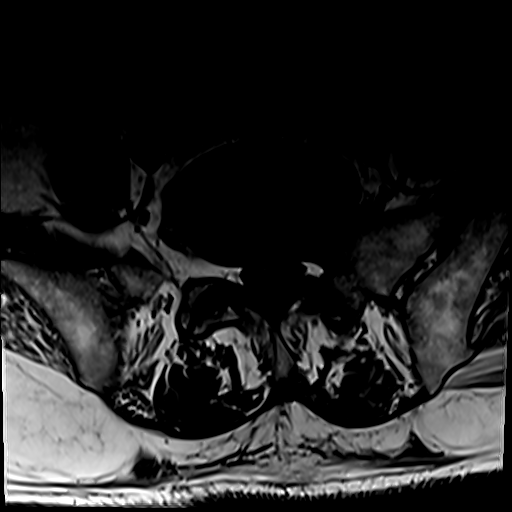
[im 10/33]
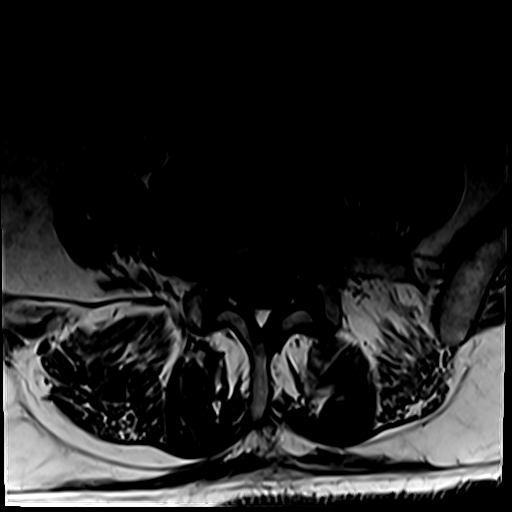
[im 15/33]
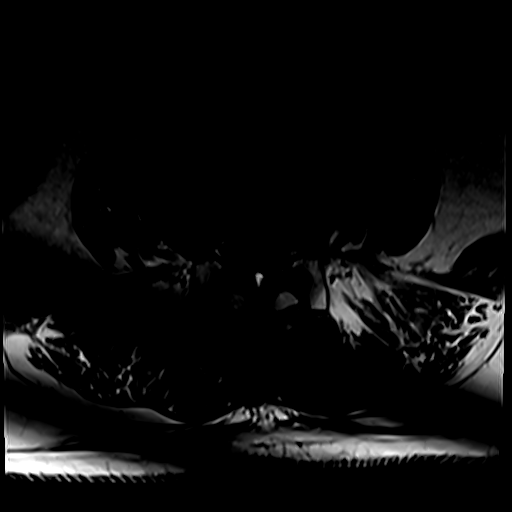
[im 18/33]
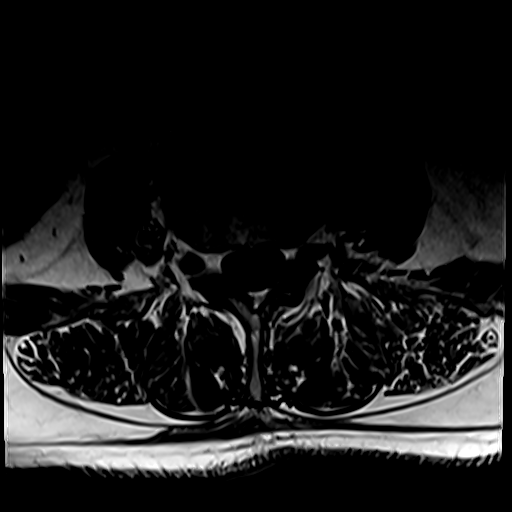
[im 23/33]
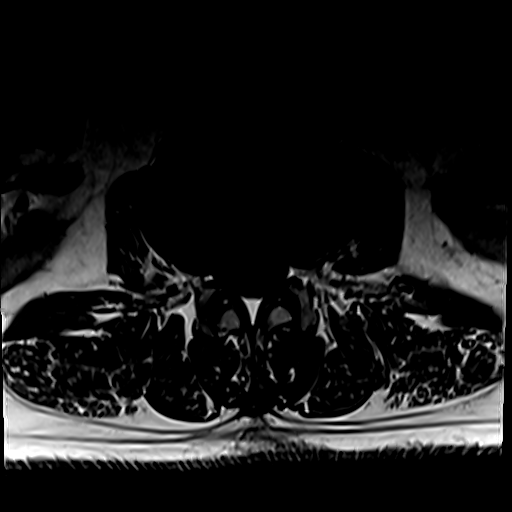
[im 28/33]
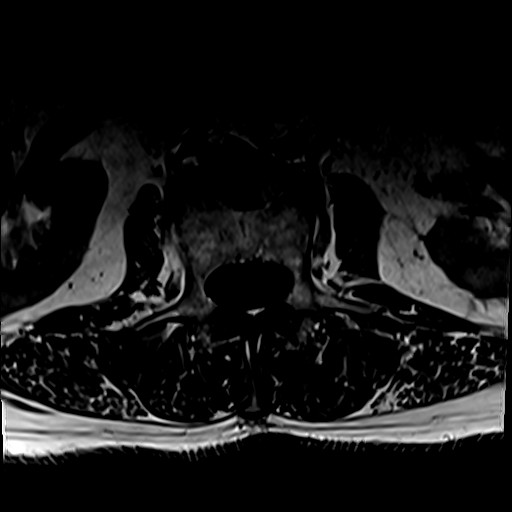
[im 33/33]
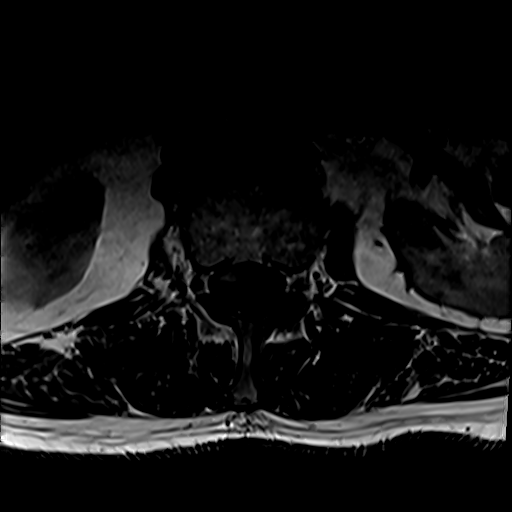

[30 of 48 positions shown; findings below may reference images not displayed]

FINDINGS: Segmentation:  5 lumbar type vertebral bodies.

Alignment: Straightening of the normal lumbar lordosis. 2 mm
degenerative anterolisthesis L1-2.

Vertebrae: No fracture or primary bone lesion. Tarlov cysts of the
sacrum.

Conus medullaris and cauda equina: Conus extends to the L1 level.
Conus and cauda equina appear normal.

Paraspinal and other soft tissues: Negative

Disc levels:

T12-L1: Normal

L1-2: Bilateral facet osteoarthritis with 2 mm of anterolisthesis.
Disc degeneration with endplate osteophytes and bulging of the disc.
Stenosis of both lateral recesses that could possibly cause neural
compression. Similar appearance to the study of last year.

L2-3: Endplate osteophytes and bulging of the disc. Mild facet and
ligamentous hypertrophy. Mild stenosis of the lateral recesses,
right more than left. Some potential for neural compression in the
right lateral recess. Slight worsening since last year.

L3-4: Shallow protrusion of the disc. Bilateral facet and
ligamentous hypertrophy, including presence of a synovial cyst
emanating from the facet on the right. Multifactorial spinal
stenosis that could cause neural compression on either or both
sides. Stenosis is slightly more pronounced on the right, but could
be symptomatic bilaterally. Worsening of this appearance since last
year.

L4-5: Endplate osteophytes and shallow protrusion of the disc more
prominent towards the left. Bilateral facet and ligamentous
hypertrophy. Stenosis of both lateral recesses that could affect
either L5 nerve. Slight worsening of the right lateral recess
stenosis since last year.

L5-S1: Mild bulging of the disc. Bilateral facet osteoarthritis. No
compressive canal or foraminal narrowing.
IMPRESSION: 1. L1-2: Bilateral facet arthropathy with 2 mm of anterolisthesis.
Bulging of the disc. Stenosis of both lateral recesses that could
possibly cause neural compression. Similar appearance to the study
of last year.
2. L2-3: Lateral recess stenosis right more than left. Some
potential for neural compression in the right lateral recess.
Worsening of the stenosis since last year.
3. L3-4: Multifactorial spinal stenosis worse on the right than the
left. Neural compression could occur on either or both sides, more
likely on the right. Some worsening of the stenosis since last year,
particularly on the right
4. L4-5: Bilateral lateral recess stenosis that could affect either
L5 nerve. Slight worsening of the right lateral recess stenosis
since last year.
5. L5-S1: Disc bulge and facet osteoarthritis. No compressive canal
or foraminal stenosis.

## 2021-07-12 DIAGNOSIS — E1165 Type 2 diabetes mellitus with hyperglycemia: Secondary | ICD-10-CM | POA: Diagnosis not present

## 2021-07-12 DIAGNOSIS — I10 Essential (primary) hypertension: Secondary | ICD-10-CM | POA: Diagnosis not present

## 2021-08-26 DIAGNOSIS — R7301 Impaired fasting glucose: Secondary | ICD-10-CM | POA: Diagnosis not present

## 2021-08-26 DIAGNOSIS — R42 Dizziness and giddiness: Secondary | ICD-10-CM | POA: Diagnosis not present

## 2021-08-26 DIAGNOSIS — E039 Hypothyroidism, unspecified: Secondary | ICD-10-CM | POA: Diagnosis not present

## 2021-08-29 DIAGNOSIS — M542 Cervicalgia: Secondary | ICD-10-CM | POA: Diagnosis not present

## 2021-08-29 DIAGNOSIS — E782 Mixed hyperlipidemia: Secondary | ICD-10-CM | POA: Diagnosis not present

## 2021-08-29 DIAGNOSIS — R7303 Prediabetes: Secondary | ICD-10-CM | POA: Diagnosis not present

## 2021-08-29 DIAGNOSIS — E039 Hypothyroidism, unspecified: Secondary | ICD-10-CM | POA: Diagnosis not present

## 2021-09-11 DIAGNOSIS — I10 Essential (primary) hypertension: Secondary | ICD-10-CM | POA: Diagnosis not present

## 2021-09-11 DIAGNOSIS — E782 Mixed hyperlipidemia: Secondary | ICD-10-CM | POA: Diagnosis not present

## 2021-09-16 DIAGNOSIS — R030 Elevated blood-pressure reading, without diagnosis of hypertension: Secondary | ICD-10-CM | POA: Diagnosis not present

## 2021-09-16 DIAGNOSIS — M5416 Radiculopathy, lumbar region: Secondary | ICD-10-CM | POA: Diagnosis not present

## 2021-09-16 DIAGNOSIS — Z6827 Body mass index (BMI) 27.0-27.9, adult: Secondary | ICD-10-CM | POA: Diagnosis not present

## 2021-09-17 ENCOUNTER — Encounter: Payer: Self-pay | Admitting: Internal Medicine

## 2021-09-17 ENCOUNTER — Other Ambulatory Visit: Payer: Self-pay

## 2021-09-17 ENCOUNTER — Ambulatory Visit: Payer: Medicare Other | Admitting: Internal Medicine

## 2021-09-17 VITALS — BP 128/67 | HR 58 | Temp 97.3°F | Ht 74.0 in | Wt 218.0 lb

## 2021-09-17 DIAGNOSIS — K642 Third degree hemorrhoids: Secondary | ICD-10-CM | POA: Diagnosis not present

## 2021-09-17 NOTE — Progress Notes (Signed)
Primary Care Physician:  Celene Squibb, MD Primary Gastroenterologist:  Dr.   Pre-Procedure History & Physical: HPI:  Carlos Butler is a 80 y.o. male here for for evaluation of anorectal discomfort.  Protruding hemorrhoids.  Has had some discomfort and difficulty with hygiene over the past several weeks.  No bleeding.  Occasionally strains.  Does take MiraLAX on demand for constipation.  Had symptomatic hemorrhoids back in 2019 status post banding of the right anterior column.  This was associated with resolution of his symptoms until just recently. History of polyps.  Last colonoscopy clean 2019-no future colonoscopy recommended.    Past Medical History:  Diagnosis Date   Diverticulosis    Hemorrhoids    Hyperlipidemia    Hypothyroidism    Vertigo     Past Surgical History:  Procedure Laterality Date   COLONOSCOPY  01/03/2002   diverticulosis   COLONOSCOPY  01/13/2007   Dr. Gala Romney- small external hemorrhoidal tag, L side and transverse diverticula   COLONOSCOPY  03/05/2012   Procedure: COLONOSCOPY;  Surgeon: Daneil Dolin, MD;  Location: [facility];  Service: Endoscopy;  Laterality: N/A;  7:30   COLONOSCOPY N/A 04/24/2017   Procedure: COLONOSCOPY;  Surgeon: Daneil Dolin, MD;  [facility];  Service: Endoscopy;  Laterality: N/A;  [place]  03/14/2009   Dr. Romona Curls   HERNIA REPAIR     bilateral inguinal hernia    Prior to Admission medications   Medication Sig Start Date End Date Taking? Authorizing Provider  acetaminophen (TYLENOL) 500 MG tablet Take 500-1,000 mg by mouth every 6 (six) hours as needed for mild pain or moderate pain.   Yes [provider]  Ascorbic Acid (VITAMIN C) 1000 MG tablet Take 1,000 mg by mouth daily.   Yes [provider]  aspirin EC 81 MG tablet Take 81 mg by mouth every morning.    Yes [provider]  atorvastatin (LIPITOR) 40 MG tablet Take 40 mg by mouth daily.   Yes [provider]  cholecalciferol (VITAMIN D3) 25 MCG (1000 UNIT) tablet Take 1,000 Units by mouth daily.   Yes [provider]  Hydrocortisone (PROCTOZONE-HC EX) Apply topically as needed.   Yes [provider]  hydrocortisone cream 1 % Apply 1 application topically as needed for itching.   Yes [provider]  ibuprofen (ADVIL,MOTRIN) 200 MG tablet Take 400 mg by mouth every 6 (six) hours as needed for moderate pain.   Yes [provider]  levothyroxine (SYNTHROID) 175 MCG tablet Take 175 mcg by mouth every morning. 03/07/17  Yes [provider]  polyethylene glycol (MIRALAX / GLYCOLAX) packet Take 17 g by mouth every morning. *Mixed with orange juice   Yes [provider]  zinc gluconate 50 MG tablet Take 50 mg by mouth daily.   Yes [provider]    Allergies as of 09/17/2021   (No Known Allergies)    Family History  Problem Relation Age of Onset   Colon polyps Brother        malignant   Anesthesia problems Neg Hx    Hypotension Neg Hx    Malignant hyperthermia Neg Hx    Pseudochol deficiency Neg Hx     Social History   Socioeconomic History   Marital status: Single    Spouse name: Not on file   Number of children: Not on file   Years of education: Not on file   Highest education  level: Not on file  Occupational History   Not on file  Tobacco Use   Smoking status: Never   Smokeless tobacco: Never  Substance and Sexual Activity   Alcohol use: No   Drug use: No   Sexual activity: Yes  Other Topics Concern   Not on file  Social History Narrative   Not on file   Social Determinants of Health   Financial Resource Strain: Not on file  Food Insecurity: Not on file  Transportation Needs: Not on file  Physical Activity: Not on file  Stress: Not on file  Social Connections: Not on file  Intimate Partner Violence: Not on file    Review of Systems: See HPI, otherwise negative ROS  Physical Exam: BP  128/67   Pulse (!) 58   Temp (!) 97.3 F (36.3 C)   Ht 6\' 2"  (1.88 m)   Wt 218 lb (98.9 kg)   BMI 27.99 kg/m  General:   Alert,  Well-developed, well-nourished, pleasant and cooperative in NAD Neck:  Supple; no masses or thyromegaly. No significant cervical adenopathy. Lungs:  Clear throughout to auscultation.   No wheezes, crackles, or rhonchi. No acute distress. Heart:  Regular rate and rhythm; no murmurs, clicks, rubs,  or gallops. Abdomen: Non-distended, normal bowel sounds.  Soft and nontender without appreciable mass or hepatosplenomegaly.  Pulses:  Normal pulses noted. Extremities:  Without clubbing or edema. Examination of buttock perianal area revealed grade 3 hemorrhoids.  Also he has a red erythematous scaly rash with intense leading edge around both on both buttocks around the perianal area suspicious for tinea infection.   Impression:  80 year old gentleman presents with symptomatic hemorrhoids and a perianal/buttock rash consistent with tinea. He would likely benefit from hemorrhoid banding and treatment of cutaneous tinea infection.  [place] banding procedure note:  The patient presents with symptomatic grade 3 hemorrhoids, unresponsive to maximal medical therapy, requesting rubber band ligation of his/her hemorrhoidal disease. All risks, benefits, and alternative forms of therapy were described and informed consent was obtained.  In the left lateral decubitus position (if anoscopy is performed) anoscopic examination revealed grade 3  hemorrhoids in the right posterior and anterior position.  The decision was made to band the the right anterior and posterior column internal hemorrhoid, and the [place] was used to perform band ligation without complication.  After engaging the right anterior hemorrhoid band was deployed however deployment was not adequate.  A band in fact fell off with DRE.  Subsequently loaded another band and placed this effectively without apparent  complication subsequently placed another band on the right posterior column.  A follow-up DRE revealed the band to be in excellent position.  No pinching or pain following the procedure.  A pea-sized amount of Xylocaine ointment was used for topical anesthesia.  Digital anorectal examination was then performed to assure proper positioning of the band, and to adjust the banded tissue as required. The patient was discharged home without pain or other issues.  No complications were encountered and the patient tolerated the procedure well.    Avoid straining.  Begin Benefiber 1 tablespoon daily for 3 weeks; then increase to 2 tablespoons daily thereafter  May continue to use MiraLAX as needed if constipation occurs  Limit toilet time to 2-3 minutes  Keep perianal and buttocks area as dry as possible.  Try 1% clotrimazole cream apply to rash twice daily-should be over-the-counter preparation  Please address perianal/buttock rash with dermatologist when you see them next week  Call with any interim problems  Schedule followup appointment in 6 weeks from now         Notice: This dictation was prepared with Dragon dictation along with smaller phrase technology. Any transcriptional errors that result from this process are unintentional and may not be corrected upon review.

## 2021-09-17 NOTE — Patient Instructions (Addendum)
Avoid straining.  Begin Benefiber 1 tablespoon daily for 3 weeks; then increase to 2 tablespoons daily thereafter  May continue to use MiraLAX as needed if constipation occurs  Limit toilet time to 2-3 minutes  Keep perianal and buttocks area as dry as possible.  Try 1% clotrimazole cream apply to rash twice daily-should be over-the-counter preparation  Please address perianal/buttock rash with dermatologist when you see them next week  Call with any interim problems  Schedule followup appointment in 6 weeks from now

## 2021-09-23 ENCOUNTER — Other Ambulatory Visit: Payer: Self-pay | Admitting: Neurosurgery

## 2021-09-25 DIAGNOSIS — L304 Erythema intertrigo: Secondary | ICD-10-CM | POA: Diagnosis not present

## 2021-09-25 DIAGNOSIS — D225 Melanocytic nevi of trunk: Secondary | ICD-10-CM | POA: Diagnosis not present

## 2021-09-25 DIAGNOSIS — Z1283 Encounter for screening for malignant neoplasm of skin: Secondary | ICD-10-CM | POA: Diagnosis not present

## 2021-09-25 DIAGNOSIS — D2272 Melanocytic nevi of left lower limb, including hip: Secondary | ICD-10-CM | POA: Diagnosis not present

## 2021-09-25 DIAGNOSIS — L258 Unspecified contact dermatitis due to other agents: Secondary | ICD-10-CM | POA: Diagnosis not present

## 2021-10-24 ENCOUNTER — Other Ambulatory Visit: Payer: Self-pay | Admitting: Neurosurgery

## 2021-10-25 ENCOUNTER — Encounter (HOSPITAL_COMMUNITY): Payer: Self-pay

## 2021-10-25 ENCOUNTER — Other Ambulatory Visit (HOSPITAL_COMMUNITY): Payer: Medicare Other

## 2021-10-25 ENCOUNTER — Encounter (HOSPITAL_COMMUNITY)
Admission: RE | Admit: 2021-10-25 | Discharge: 2021-10-25 | Disposition: A | Discharge status: Home / Self Care | Payer: Medicare Other | Source: Ambulatory Visit | Attending: Neurosurgery | Admitting: Neurosurgery

## 2021-10-25 ENCOUNTER — Other Ambulatory Visit: Payer: Self-pay

## 2021-10-25 DIAGNOSIS — M539 Dorsopathy, unspecified: Secondary | ICD-10-CM | POA: Insufficient documentation

## 2021-10-25 DIAGNOSIS — Z01812 Encounter for preprocedural laboratory examination: Secondary | ICD-10-CM | POA: Diagnosis not present

## 2021-10-25 DIAGNOSIS — Z20822 Contact with and (suspected) exposure to covid-19: Secondary | ICD-10-CM | POA: Diagnosis not present

## 2021-10-25 HISTORY — DX: Unspecified osteoarthritis, unspecified site: M19.90

## 2021-10-25 HISTORY — DX: Other complications of anesthesia, initial encounter: T88.59XA

## 2021-10-25 LAB — BASIC METABOLIC PANEL
Anion gap: 10 (ref 5–15)
BUN: 11 mg/dL (ref 8–23)
CO2: 22 mmol/L (ref 22–32)
Calcium: 9 mg/dL (ref 8.9–10.3)
Chloride: 106 mmol/L (ref 98–111)
Creatinine, Ser: 0.71 mg/dL (ref 0.61–1.24)
GFR, Estimated: >60 mL/min (ref >60)
Glucose, Bld: 101 mg/dL — ABNORMAL HIGH (ref 70–99)
Potassium: 3.9 mmol/L (ref 3.5–5.1)
Sodium: 138 mmol/L (ref 135–145)

## 2021-10-25 LAB — CBC
HCT: 44.3 % (ref 39.0–52.0)
Hemoglobin: 14.1 g/dL (ref 13.0–17.0)
MCH: 30.5 pg (ref 26.0–34.0)
MCHC: 31.8 g/dL (ref 30.0–36.0)
MCV: 95.7 fL (ref 80.0–100.0)
Platelets: 142 10*3/uL — ABNORMAL LOW (ref 150–400)
RBC: 4.63 MIL/uL (ref 4.22–5.81)
RDW: 12.9 % (ref 11.5–15.5)
WBC: 6.4 10*3/uL (ref 4.0–10.5)
nRBC: 0 % (ref 0.0–0.2)

## 2021-10-25 LAB — SARS CORONAVIRUS 2 (TAT 6-24 HRS): SARS Coronavirus 2: NEGATIVE

## 2021-10-25 LAB — SURGICAL PCR SCREEN
MRSA, PCR: NEGATIVE
Staphylococcus aureus: NEGATIVE

## 2021-10-25 LAB — TYPE AND SCREEN
ABO/RH(D): O POS
Antibody Screen: NEGATIVE

## 2021-10-25 NOTE — Pre-Procedure Instructions (Addendum)
Carlos Butler  10/25/2021      Your procedure is scheduled on Tuesday, January 17.  Report to [facility], Main Entrance or Entrance "A" at 7:30 AM.                Your surgery or procedure is scheduled to begin at 9:30 AM   Call this number if you have problems the morning of surgery: [phone]  This is the number for the Pre- Surgical Desk.                For any other questions, please call [phone], Monday - Friday 8 AM - 4 PM.   Remember:  Do not eat or drink after midnight Monday, January 16.  Take these medicines the morning of surgery with A SIP OF WATER : atorvastatin (LIPITOR) levothyroxine (SYNTHROID)  Take if needed: acetaminophen (TYLENOL)  Follow your surgeon's instructions regarding Aspirin.   As of today,STOP taking Aspirin Products (Goody Powder, Excedrin Migraine), Ibuprofen (Advil), Naproxen (Aleve), Vitamins and Herbal Products (ie Fish Oil).    Special instructions:    [place]- Preparing For Surgery  Before surgery, you can play an important role. Because skin is not sterile, your skin needs to be as free of germs as possible. You can reduce the number of germs on your skin by washing with CHG (chlorahexidine gluconate) Soap before surgery.  CHG is an antiseptic cleaner which kills germs and bonds with the skin to continue killing germs even after washing.    Oral Hygiene is also important to reduce your risk of infection.  Remember - BRUSH YOUR TEETH THE MORNING OF SURGERY WITH YOUR REGULAR TOOTHPASTE  Please do not use if you have an allergy to CHG or antibacterial soaps. If your skin becomes reddened/irritated stop using the CHG.  Do not shave (including legs and underarms) for at least 48 hours prior to first CHG shower. It is OK to shave your face.  Please follow these instructions carefully.   Shower the NIGHT BEFORE SURGERY and the MORNING OF SURGERY with CHG.   If you chose to wash your hair, wash your hair first as usual  with your normal shampoo.  After you shampoo, wash your face and private area with the soap you use at home, then rinse your hair and body thoroughly to remove the shampoo and soap.  Use CHG as you would any other liquid soap. You can apply CHG directly to the skin and wash gently with a scrungie or a clean washcloth.   Apply the CHG Soap to your body ONLY FROM THE NECK DOWN.  Do not use on open wounds or open sores. Avoid contact with your eyes, ears, mouth and genitals (private parts).   Wash thoroughly, paying special attention to the area where your surgery will be performed.  Thoroughly rinse your body with warm water from the neck down.  DO NOT shower/wash with your normal soap after using and rinsing off the CHG Soap.  Pat yourself dry with a CLEAN TOWEL.  Wear CLEAN PAJAMAS to bed the night before surgery, wear comfortable clothes the morning of surgery  Place CLEAN SHEETS on your bed the night of your first shower and DO NOT SLEEP WITH PETS.  Day of Surgery: Shower as instructed above. Do not apply any deodorants/lotions, powders or colognes.  Please wear clean clothes to the hospital/surgery center.   Remember to brush your teeth WITH YOUR REGULAR TOOTHPASTE.  Do not wear jewelry,  make-up or nail polish.  Do not shave 48 hours prior to surgery.  Men may shave face and neck.  Do not bring valuables to the hospital.  [facility] is not responsible for any belongings or valuables.  Contacts, dentures or bridgework may not be worn into surgery.  Leave your suitcase in the car.  After surgery it may be brought to your room.  For patients admitted to the hospital, discharge time will be determined by your treatment team.  Patients discharged the day of surgery will not be allowed to drive home.   Please read over the fact sheets that you were given.

## 2021-10-25 NOTE — Progress Notes (Signed)
PCP - Dr. Casimer Leek  Cardiologist - none  EP-none  Endocrine-none  Pulm-none  Chest x-ray - na  EKG - na  Stress Test - never  ECHO - never  Cardiac Cath - never  AICD-no PM-no LOOP-no  Dialysis-no  Sleep Study - no CPAP - no  LABS-CBC, BMP, PCR, Covid  ASA-stopped on 10/22/21 per Dr Marcello Moores instructions.  ERAS-no  HA1C-na Fasting Blood Sugar - na Checks Blood Sugar _0____ times a day  Anesthesia-  Pt denies having chest pain, sob, or fever at this time. All instructions explained to the pt, with a verbal understanding of the material. Pt agrees to go over the instructions while at home for a better understanding. Pt also instructed to self quarantine after being tested for COVID-19. The opportunity to ask questions was provided.

## 2021-10-25 NOTE — Progress Notes (Signed)
Mr. Emberson was seen in [facility] ED 2020 with dizziness.  Patient was discharged with Antivert to take if needed. Mr. Budai reported that he rarely has vertigo, but if he does he takes 1 Antivert and it will go away.

## 2021-10-29 ENCOUNTER — Inpatient Hospital Stay (HOSPITAL_COMMUNITY): Payer: Medicare Other

## 2021-10-29 ENCOUNTER — Encounter (HOSPITAL_COMMUNITY): Payer: Self-pay

## 2021-10-29 ENCOUNTER — Inpatient Hospital Stay (HOSPITAL_COMMUNITY): Payer: Medicare Other | Admitting: Physician Assistant

## 2021-10-29 ENCOUNTER — Other Ambulatory Visit: Payer: Self-pay

## 2021-10-29 ENCOUNTER — Encounter (HOSPITAL_COMMUNITY)
Admission: RE | Disposition: A | Discharge status: Home Health | Payer: Self-pay | Source: Home / Self Care | Attending: Neurosurgery

## 2021-10-29 ENCOUNTER — Inpatient Hospital Stay (HOSPITAL_COMMUNITY)
Admission: RE | Admit: 2021-10-29 | Discharge: 2021-10-30 | DRG: 455 | Disposition: A | Discharge status: Home Health | Payer: Medicare Other | Attending: Neurosurgery | Admitting: Neurosurgery

## 2021-10-29 DIAGNOSIS — Z7982 Long term (current) use of aspirin: Secondary | ICD-10-CM

## 2021-10-29 DIAGNOSIS — M532X6 Spinal instabilities, lumbar region: Secondary | ICD-10-CM | POA: Diagnosis not present

## 2021-10-29 DIAGNOSIS — M48061 Spinal stenosis, lumbar region without neurogenic claudication: Principal | ICD-10-CM | POA: Diagnosis present

## 2021-10-29 DIAGNOSIS — M4326 Fusion of spine, lumbar region: Secondary | ICD-10-CM | POA: Diagnosis not present

## 2021-10-29 DIAGNOSIS — M5416 Radiculopathy, lumbar region: Secondary | ICD-10-CM | POA: Diagnosis present

## 2021-10-29 DIAGNOSIS — E039 Hypothyroidism, unspecified: Secondary | ICD-10-CM | POA: Diagnosis present

## 2021-10-29 DIAGNOSIS — M1990 Unspecified osteoarthritis, unspecified site: Secondary | ICD-10-CM | POA: Diagnosis not present

## 2021-10-29 DIAGNOSIS — Z79890 Hormone replacement therapy (postmenopausal): Secondary | ICD-10-CM | POA: Diagnosis not present

## 2021-10-29 DIAGNOSIS — Z20822 Contact with and (suspected) exposure to covid-19: Secondary | ICD-10-CM | POA: Diagnosis not present

## 2021-10-29 DIAGNOSIS — Z79899 Other long term (current) drug therapy: Secondary | ICD-10-CM | POA: Diagnosis not present

## 2021-10-29 DIAGNOSIS — K5790 Diverticulosis of intestine, part unspecified, without perforation or abscess without bleeding: Secondary | ICD-10-CM | POA: Diagnosis present

## 2021-10-29 DIAGNOSIS — E785 Hyperlipidemia, unspecified: Secondary | ICD-10-CM | POA: Diagnosis not present

## 2021-10-29 DIAGNOSIS — M539 Dorsopathy, unspecified: Secondary | ICD-10-CM

## 2021-10-29 DIAGNOSIS — M5116 Intervertebral disc disorders with radiculopathy, lumbar region: Secondary | ICD-10-CM | POA: Diagnosis present

## 2021-10-29 DIAGNOSIS — Z419 Encounter for procedure for purposes other than remedying health state, unspecified: Secondary | ICD-10-CM

## 2021-10-29 DIAGNOSIS — Z981 Arthrodesis status: Secondary | ICD-10-CM | POA: Diagnosis not present

## 2021-10-29 HISTORY — PX: TRANSFORAMINAL LUMBAR INTERBODY FUSION W/ MIS 2 LEVEL: SHX6146

## 2021-10-29 LAB — ABO/RH: ABO/RH(D): O POS

## 2021-10-29 SURGERY — MINIMALLY INVASIVE (MIS) TRANSFORAMINAL LUMBAR INTERBODY FUSION (TLIF) 2 LEVEL
Anesthesia: General | Site: Spine Lumbar

## 2021-10-29 MED ORDER — FENTANYL CITRATE (PF) 250 MCG/5ML IJ SOLN
INTRAMUSCULAR | Status: AC
  Filled 2021-10-29: qty 5

## 2021-10-29 MED ORDER — BUPIVACAINE LIPOSOME 1.3 % IJ SUSP
INTRAMUSCULAR | Status: DC | PRN
  Administered 2021-10-29: 20 mL

## 2021-10-29 MED ORDER — ORAL CARE MOUTH RINSE: 15.0000 mL | Freq: Once | OROMUCOSAL | Status: AC

## 2021-10-29 MED ORDER — 0.9 % SODIUM CHLORIDE (POUR BTL) OPTIME
TOPICAL | Status: DC | PRN
  Administered 2021-10-29: 1000 mL

## 2021-10-29 MED ORDER — ACETAMINOPHEN 500 MG PO TABS: 1000.0000 mg | ORAL_TABLET | Freq: Once | ORAL | Status: DC | PRN

## 2021-10-29 MED ORDER — BUPIVACAINE HCL (PF) 0.5 % IJ SOLN
INTRAMUSCULAR | Status: DC | PRN
  Administered 2021-10-29: 5 mL

## 2021-10-29 MED ORDER — ACETAMINOPHEN 500 MG PO TABS
ORAL_TABLET | ORAL | Status: AC
  Filled 2021-10-29: qty 2

## 2021-10-29 MED ORDER — THROMBIN 5000 UNITS EX SOLR
CUTANEOUS | Status: AC
  Filled 2021-10-29: qty 5000

## 2021-10-29 MED ORDER — FLEET ENEMA 7-19 GM/118ML RE ENEM: 1.0000 | ENEMA | Freq: Once | RECTAL | Status: DC | PRN

## 2021-10-29 MED ORDER — ACETAMINOPHEN 10 MG/ML IV SOLN
INTRAVENOUS | Status: AC
  Filled 2021-10-29: qty 100

## 2021-10-29 MED ORDER — THROMBIN 5000 UNITS EX SOLR
OROMUCOSAL | Status: DC | PRN
  Administered 2021-10-29: 5 mL via TOPICAL

## 2021-10-29 MED ORDER — DEXAMETHASONE SODIUM PHOSPHATE 10 MG/ML IJ SOLN
INTRAMUSCULAR | Status: DC | PRN
Start: 2021-10-29 — End: 2021-10-29
  Administered 2021-10-29: 10 mg via INTRAVENOUS

## 2021-10-29 MED ORDER — HYDROCORTISONE (PERIANAL) 2.5 % EX CREA
TOPICAL_CREAM | Freq: Every day | CUTANEOUS | Status: DC | PRN
  Filled 2021-10-29: qty 28.35

## 2021-10-29 MED ORDER — MENTHOL 3 MG MT LOZG: 1.0000 | LOZENGE | OROMUCOSAL | Status: DC | PRN

## 2021-10-29 MED ORDER — ACETAMINOPHEN 10 MG/ML IV SOLN
1000.0000 mg | Freq: Once | INTRAVENOUS | Status: DC | PRN
  Administered 2021-10-29: 1000 mg via INTRAVENOUS

## 2021-10-29 MED ORDER — FENTANYL CITRATE (PF) 100 MCG/2ML IJ SOLN
25.0000 ug | INTRAMUSCULAR | Status: DC | PRN
  Administered 2021-10-29 (×3): 50 ug via INTRAVENOUS

## 2021-10-29 MED ORDER — ATORVASTATIN CALCIUM 40 MG PO TABS
40.0000 mg | ORAL_TABLET | Freq: Every day | ORAL | Status: DC
  Administered 2021-10-30: 40 mg via ORAL
  Filled 2021-10-29: qty 1

## 2021-10-29 MED ORDER — ASPIRIN EC 81 MG PO TBEC: 81.0000 mg | DELAYED_RELEASE_TABLET | Freq: Every morning | ORAL | Status: DC

## 2021-10-29 MED ORDER — CHLORHEXIDINE GLUCONATE CLOTH 2 % EX PADS: 6.0000 | MEDICATED_PAD | Freq: Once | CUTANEOUS | Status: DC

## 2021-10-29 MED ORDER — OXYCODONE HCL 5 MG/5ML PO SOLN: 5.0000 mg | Freq: Once | ORAL | Status: DC | PRN

## 2021-10-29 MED ORDER — DEXAMETHASONE SODIUM PHOSPHATE 10 MG/ML IJ SOLN
INTRAMUSCULAR | Status: AC
  Filled 2021-10-29: qty 1

## 2021-10-29 MED ORDER — BUPIVACAINE HCL (PF) 0.5 % IJ SOLN
INTRAMUSCULAR | Status: AC
  Filled 2021-10-29: qty 30

## 2021-10-29 MED ORDER — ONDANSETRON HCL 4 MG PO TABS: 4.0000 mg | ORAL_TABLET | Freq: Four times a day (QID) | ORAL | Status: DC | PRN

## 2021-10-29 MED ORDER — FENTANYL CITRATE (PF) 100 MCG/2ML IJ SOLN
INTRAMUSCULAR | Status: AC
  Filled 2021-10-29: qty 2

## 2021-10-29 MED ORDER — LIDOCAINE-EPINEPHRINE 1 %-1:100000 IJ SOLN
INTRAMUSCULAR | Status: AC
  Filled 2021-10-29: qty 1

## 2021-10-29 MED ORDER — CEFAZOLIN SODIUM-DEXTROSE 2-4 GM/100ML-% IV SOLN
2.0000 g | INTRAVENOUS | Status: AC
  Administered 2021-10-29: 2 g via INTRAVENOUS
  Filled 2021-10-29: qty 100

## 2021-10-29 MED ORDER — ENOXAPARIN SODIUM 40 MG/0.4ML IJ SOSY
40.0000 mg | PREFILLED_SYRINGE | INTRAMUSCULAR | Status: DC
  Administered 2021-10-30: 40 mg via SUBCUTANEOUS
  Filled 2021-10-29: qty 0.4

## 2021-10-29 MED ORDER — HYDROMORPHONE HCL 1 MG/ML IJ SOLN
INTRAMUSCULAR | Status: AC
  Filled 2021-10-29: qty 0.5

## 2021-10-29 MED ORDER — EPHEDRINE SULFATE-NACL 50-0.9 MG/10ML-% IV SOSY
PREFILLED_SYRINGE | INTRAVENOUS | Status: DC | PRN
  Administered 2021-10-29: 5 mg via INTRAVENOUS

## 2021-10-29 MED ORDER — PROPOFOL 10 MG/ML IV BOLUS
INTRAVENOUS | Status: AC
  Filled 2021-10-29: qty 20

## 2021-10-29 MED ORDER — SODIUM CHLORIDE 0.9% FLUSH
3.0000 mL | Freq: Two times a day (BID) | INTRAVENOUS | Status: DC
  Administered 2021-10-29: 3 mL via INTRAVENOUS

## 2021-10-29 MED ORDER — PHENOL 1.4 % MT LIQD: 1.0000 | OROMUCOSAL | Status: DC | PRN

## 2021-10-29 MED ORDER — SODIUM CHLORIDE 0.9 % IV SOLN: 250.0000 mL | INTRAVENOUS | Status: DC

## 2021-10-29 MED ORDER — PROPOFOL 10 MG/ML IV BOLUS
INTRAVENOUS | Status: DC | PRN
Start: 2021-10-29 — End: 2021-10-29
  Administered 2021-10-29: 130 mg via INTRAVENOUS

## 2021-10-29 MED ORDER — OXYCODONE HCL 5 MG PO TABS: 10.0000 mg | ORAL_TABLET | ORAL | Status: DC | PRN

## 2021-10-29 MED ORDER — EPHEDRINE 5 MG/ML INJ
INTRAVENOUS | Status: AC
  Filled 2021-10-29: qty 5

## 2021-10-29 MED ORDER — ONDANSETRON HCL 4 MG/2ML IJ SOLN
4.0000 mg | Freq: Four times a day (QID) | INTRAMUSCULAR | Status: DC | PRN
  Administered 2021-10-29: 4 mg via INTRAVENOUS
  Filled 2021-10-29: qty 2

## 2021-10-29 MED ORDER — ASCORBIC ACID 500 MG PO TABS
1000.0000 mg | ORAL_TABLET | Freq: Every day | ORAL | Status: DC
Start: 2021-10-29 — End: 2021-10-30
  Administered 2021-10-29 – 2021-10-30 (×2): 1000 mg via ORAL
  Filled 2021-10-29 (×2): qty 2

## 2021-10-29 MED ORDER — OXYCODONE HCL 5 MG PO TABS: 5.0000 mg | ORAL_TABLET | Freq: Once | ORAL | Status: DC | PRN

## 2021-10-29 MED ORDER — CHLORHEXIDINE GLUCONATE 0.12 % MT SOLN
15.0000 mL | Freq: Once | OROMUCOSAL | Status: AC
  Administered 2021-10-29: 15 mL via OROMUCOSAL
  Filled 2021-10-29: qty 15

## 2021-10-29 MED ORDER — FENTANYL CITRATE (PF) 250 MCG/5ML IJ SOLN
INTRAMUSCULAR | Status: DC | PRN
  Administered 2021-10-29 (×3): 50 ug via INTRAVENOUS
  Administered 2021-10-29 (×5): 25 ug via INTRAVENOUS

## 2021-10-29 MED ORDER — ZINC SULFATE 220 (50 ZN) MG PO CAPS
220.0000 mg | ORAL_CAPSULE | Freq: Every day | ORAL | Status: DC
  Administered 2021-10-29 – 2021-10-30 (×2): 220 mg via ORAL
  Filled 2021-10-29 (×2): qty 1

## 2021-10-29 MED ORDER — METHOCARBAMOL 1000 MG/10ML IJ SOLN
500.0000 mg | Freq: Four times a day (QID) | INTRAVENOUS | Status: DC | PRN
  Filled 2021-10-29: qty 5

## 2021-10-29 MED ORDER — POLYETHYLENE GLYCOL 3350 17 G PO PACK: 17.0000 g | PACK | Freq: Every day | ORAL | Status: DC | PRN

## 2021-10-29 MED ORDER — OXYCODONE HCL 5 MG PO TABS
5.0000 mg | ORAL_TABLET | ORAL | Status: DC | PRN
  Administered 2021-10-29: 5 mg via ORAL
  Filled 2021-10-29: qty 1

## 2021-10-29 MED ORDER — LACTATED RINGERS IV SOLN: INTRAVENOUS | Status: DC

## 2021-10-29 MED ORDER — ACETAMINOPHEN 500 MG PO TABS
ORAL_TABLET | ORAL | Status: AC
  Filled 2021-10-29: qty 1

## 2021-10-29 MED ORDER — POTASSIUM CHLORIDE IN NACL 20-0.9 MEQ/L-% IV SOLN: INTRAVENOUS | Status: DC

## 2021-10-29 MED ORDER — LIDOCAINE-EPINEPHRINE 1 %-1:100000 IJ SOLN
INTRAMUSCULAR | Status: DC | PRN
  Administered 2021-10-29: 12 mL

## 2021-10-29 MED ORDER — METHOCARBAMOL 500 MG PO TABS
500.0000 mg | ORAL_TABLET | Freq: Four times a day (QID) | ORAL | Status: DC | PRN
  Administered 2021-10-29 – 2021-10-30 (×3): 500 mg via ORAL
  Filled 2021-10-29 (×3): qty 1

## 2021-10-29 MED ORDER — VITAMIN D 25 MCG (1000 UNIT) PO TABS
1000.0000 [IU] | ORAL_TABLET | Freq: Every day | ORAL | Status: DC
  Administered 2021-10-29 – 2021-10-30 (×2): 1000 [IU] via ORAL
  Filled 2021-10-29 (×2): qty 1

## 2021-10-29 MED ORDER — ACETAMINOPHEN 500 MG PO TABS
1000.0000 mg | ORAL_TABLET | Freq: Once | ORAL | Status: AC
  Administered 2021-10-29: 1000 mg via ORAL

## 2021-10-29 MED ORDER — ACETAMINOPHEN 160 MG/5ML PO SOLN: 1000.0000 mg | Freq: Once | ORAL | Status: DC | PRN

## 2021-10-29 MED ORDER — LEVOTHYROXINE SODIUM 75 MCG PO TABS
175.0000 ug | ORAL_TABLET | Freq: Every morning | ORAL | Status: DC
  Administered 2021-10-30: 175 ug via ORAL
  Filled 2021-10-29: qty 1

## 2021-10-29 MED ORDER — SODIUM CHLORIDE 0.9% FLUSH: 3.0000 mL | INTRAVENOUS | Status: DC | PRN

## 2021-10-29 MED ORDER — BUPIVACAINE LIPOSOME 1.3 % IJ SUSP
INTRAMUSCULAR | Status: AC
  Filled 2021-10-29: qty 20

## 2021-10-29 MED ORDER — DOCUSATE SODIUM 100 MG PO CAPS
100.0000 mg | ORAL_CAPSULE | Freq: Two times a day (BID) | ORAL | Status: DC
  Administered 2021-10-29 – 2021-10-30 (×2): 100 mg via ORAL
  Filled 2021-10-29 (×2): qty 1

## 2021-10-29 MED ORDER — ROCURONIUM BROMIDE 10 MG/ML (PF) SYRINGE
PREFILLED_SYRINGE | INTRAVENOUS | Status: AC
  Filled 2021-10-29: qty 20

## 2021-10-29 MED ORDER — ROCURONIUM BROMIDE 10 MG/ML (PF) SYRINGE
PREFILLED_SYRINGE | INTRAVENOUS | Status: DC | PRN
Start: 2021-10-29 — End: 2021-10-29
  Administered 2021-10-29: 80 mg via INTRAVENOUS
  Administered 2021-10-29 (×5): 20 mg via INTRAVENOUS

## 2021-10-29 MED ORDER — ONDANSETRON HCL 4 MG/2ML IJ SOLN
INTRAMUSCULAR | Status: DC | PRN
  Administered 2021-10-29: 4 mg via INTRAVENOUS

## 2021-10-29 MED ORDER — HYDROMORPHONE HCL 1 MG/ML IJ SOLN
0.5000 mg | INTRAMUSCULAR | Status: DC | PRN
  Administered 2021-10-29: 0.5 mg via INTRAVENOUS

## 2021-10-29 MED ORDER — ACETAMINOPHEN 325 MG PO TABS
650.0000 mg | ORAL_TABLET | ORAL | Status: DC | PRN
  Administered 2021-10-30 (×3): 650 mg via ORAL
  Filled 2021-10-29 (×3): qty 2

## 2021-10-29 MED ORDER — CEFAZOLIN SODIUM-DEXTROSE 1-4 GM/50ML-% IV SOLN
1.0000 g | Freq: Three times a day (TID) | INTRAVENOUS | Status: DC
  Administered 2021-10-29 – 2021-10-30 (×2): 1 g via INTRAVENOUS
  Filled 2021-10-29 (×2): qty 50

## 2021-10-29 MED ORDER — PHENYLEPHRINE HCL-NACL 20-0.9 MG/250ML-% IV SOLN
INTRAVENOUS | Status: DC | PRN
  Administered 2021-10-29: 20 ug/min via INTRAVENOUS

## 2021-10-29 MED ORDER — ONDANSETRON HCL 4 MG/2ML IJ SOLN
INTRAMUSCULAR | Status: AC
  Filled 2021-10-29: qty 2

## 2021-10-29 MED ORDER — ZINC GLUCONATE 50 MG PO TABS: 50.0000 mg | ORAL_TABLET | Freq: Every day | ORAL | Status: DC

## 2021-10-29 MED ORDER — ACETAMINOPHEN 650 MG RE SUPP: 650.0000 mg | RECTAL | Status: DC | PRN

## 2021-10-29 SURGICAL SUPPLY — 77 items
BAG COUNTER SPONGE SURGICOUNT (BAG) ×2 IMPLANT
BAND RUBBER #18 3X1/16 STRL (MISCELLANEOUS) ×2 IMPLANT
BASKET BONE COLLECTION (BASKET) ×1 IMPLANT
BLADE CLIPPER SURG (BLADE) IMPLANT
BUR CARBIDE MATCH 3.0 (BURR) IMPLANT
BUR MATCHSTICK NEURO 3.0 LAGG (BURR) ×2 IMPLANT
BUR PRECISION FLUTE 5.0 (BURR) ×1 IMPLANT
CAGE EXP CATALYFT 9 (Plate) ×1 IMPLANT
CAGE INTERBODY PL LG 7X26.5X24 (Cage) ×1 IMPLANT
CANISTER SUCT 3000ML PPV (MISCELLANEOUS) ×2 IMPLANT
CNTNR URN SCR LID CUP LEK RST (MISCELLANEOUS) ×1 IMPLANT
CONT SPEC 4OZ STRL OR WHT (MISCELLANEOUS) ×1
COVER BACK TABLE 60X90IN (DRAPES) ×2 IMPLANT
DECANTER SPIKE VIAL GLASS SM (MISCELLANEOUS) ×2 IMPLANT
DERMABOND ADVANCED (GAUZE/BANDAGES/DRESSINGS) ×1
DERMABOND ADVANCED .7 DNX12 (GAUZE/BANDAGES/DRESSINGS) ×1 IMPLANT
DRAIN JACKSON PRATT 10MM FLAT (MISCELLANEOUS) IMPLANT
DRAPE 3/4 80X56 (DRAPES) ×2 IMPLANT
DRAPE C-ARM 42X72 X-RAY (DRAPES) ×2 IMPLANT
DRAPE C-ARMOR (DRAPES) ×2 IMPLANT
DRAPE LAPAROTOMY 100X72X124 (DRAPES) ×2 IMPLANT
DRAPE MICROSCOPE LEICA (MISCELLANEOUS) ×1 IMPLANT
DRSG OPSITE POSTOP 4X6 (GAUZE/BANDAGES/DRESSINGS) ×2 IMPLANT
DURAPREP 26ML APPLICATOR (WOUND CARE) ×2 IMPLANT
ELECT BLADE INSULATED 6.5IN (ELECTROSURGICAL) ×2
ELECT REM PT RETURN 9FT ADLT (ELECTROSURGICAL) ×2
ELECTRODE BLDE INSULATED 6.5IN (ELECTROSURGICAL) ×1 IMPLANT
ELECTRODE REM PT RTRN 9FT ADLT (ELECTROSURGICAL) ×1 IMPLANT
EVACUATOR SILICONE 100CC (DRAIN) IMPLANT
EXTENDER TAB GUIDE SV 5.5/6.0 (INSTRUMENTS) ×12 IMPLANT
GAUZE 4X4 16PLY ~~LOC~~+RFID DBL (SPONGE) ×1 IMPLANT
GAUZE SPONGE 4X4 12PLY STRL (GAUZE/BANDAGES/DRESSINGS) IMPLANT
GAUZE SPONGE 4X4 16PLY XRAY LF (GAUZE/BANDAGES/DRESSINGS) ×1 IMPLANT
GLOVE EXAM NITRILE XL STR (GLOVE) IMPLANT
GLOVE SURG LTX SZ7.5 (GLOVE) ×4 IMPLANT
GLOVE SURG UNDER POLY LF SZ7.5 (GLOVE) ×4 IMPLANT
GOWN STRL REUS W/ TWL LRG LVL3 (GOWN DISPOSABLE) ×1 IMPLANT
GOWN STRL REUS W/ TWL XL LVL3 (GOWN DISPOSABLE) ×1 IMPLANT
GOWN STRL REUS W/TWL 2XL LVL3 (GOWN DISPOSABLE) IMPLANT
GOWN STRL REUS W/TWL LRG LVL3 (GOWN DISPOSABLE) ×4
GOWN STRL REUS W/TWL XL LVL3 (GOWN DISPOSABLE) ×4
GUIDEWIRE BLUNT NT 450 (WIRE) ×6 IMPLANT
HEMOSTAT POWDER KIT SURGIFOAM (HEMOSTASIS) ×2 IMPLANT
KIT BASIN OR (CUSTOM PROCEDURE TRAY) ×2 IMPLANT
KIT INFUSE X SMALL 1.4CC (Orthopedic Implant) ×1 IMPLANT
KIT TURNOVER KIT B (KITS) ×2 IMPLANT
MARKER SKIN DUAL TIP RULER LAB (MISCELLANEOUS) ×2 IMPLANT
MILL MEDIUM DISP (BLADE) ×2 IMPLANT
NDL ASPIRATION RAN815N 8X15 (NEEDLE) IMPLANT
NDL HYPO 18GX1.5 BLUNT FILL (NEEDLE) IMPLANT
NDL HYPO 21X1.5 SAFETY (NEEDLE) IMPLANT
NDL SPNL 18GX3.5 QUINCKE PK (NEEDLE) IMPLANT
NEEDLE ASPIRATION RAN815N 8X15 (NEEDLE) ×2 IMPLANT
NEEDLE ASPIRATION RANFAC 8X15 (NEEDLE) ×2
NEEDLE HYPO 18GX1.5 BLUNT FILL (NEEDLE) IMPLANT
NEEDLE HYPO 21X1.5 SAFETY (NEEDLE) ×2 IMPLANT
NEEDLE HYPO 22GX1.5 SAFETY (NEEDLE) ×2 IMPLANT
NEEDLE SPNL 18GX3.5 QUINCKE PK (NEEDLE) IMPLANT
NS IRRIG 1000ML POUR BTL (IV SOLUTION) ×2 IMPLANT
PACK LAMINECTOMY NEURO (CUSTOM PROCEDURE TRAY) ×2 IMPLANT
PAD ARMBOARD 7.5X6 YLW CONV (MISCELLANEOUS) ×6 IMPLANT
PUTTY GRAFTON DBF 6CC W/DELIVE (Putty) ×2 IMPLANT
ROD PERC CCM 5.5X80 (Rod) ×2 IMPLANT
SCREW MAS VG 5.5X7.5X40 (Screw) ×1 IMPLANT
SCREW MAS VOYAGER 7.5X45 (Screw) ×1 IMPLANT
SCREW SPINAL IFIX 6.5X45 (Screw) ×4 IMPLANT
SPONGE SURGIFOAM ABS GEL 100 (HEMOSTASIS) IMPLANT
SPONGE T-LAP 4X18 ~~LOC~~+RFID (SPONGE) ×1 IMPLANT
SUT MNCRL AB 4-0 PS2 18 (SUTURE) ×2 IMPLANT
SUT VIC AB 0 CT1 18XCR BRD8 (SUTURE) ×1 IMPLANT
SUT VIC AB 0 CT1 8-18 (SUTURE) ×1
SUT VIC AB 2-0 CP2 18 (SUTURE) ×2 IMPLANT
SYR 30ML LL (SYRINGE) ×2 IMPLANT
TOWEL GREEN STERILE (TOWEL DISPOSABLE) ×2 IMPLANT
TOWEL GREEN STERILE FF (TOWEL DISPOSABLE) ×2 IMPLANT
TRAY FOLEY MTR SLVR 16FR STAT (SET/KITS/TRAYS/PACK) ×2 IMPLANT
WATER STERILE IRR 1000ML POUR (IV SOLUTION) ×2 IMPLANT

## 2021-10-29 NOTE — Progress Notes (Signed)
Orthopedic Tech Progress Note Patient Details:  Carlos Butler Jul 09, 1941 [id number]  Ortho Devices Type of Ortho Device: Lumbar corsett Ortho Device/Splint Location: BACK Ortho Device/Splint Interventions: Ordered   Post Interventions Patient Tolerated: Well Instructions Provided: Care of device  Janit Pagan 10/29/2021, 6:24 PM

## 2021-10-29 NOTE — Op Note (Signed)
PREOP DIAGNOSIS: Lumbar radiculopathy with degenerative instability  POSTOP DIAGNOSIS: Lumbar radiculopathy with degenerative instability  PROCEDURE: 1. L3-4 lumbar interbody fusion via transforaminal approach with tubular retractors 2. L4-5 lumbar interbody fusion via transforaminal approach with tubular retractors 3. Right posterolateral arthrodesis 4. Placement of interbody cage L3-4 5. Placement of interbody cage L4-5 6. Segmental instrumentation with percutaneously placed pedicle screw and rod construct at L3-4-5 bilaterally 7. Harvest of local autograft 8. Use of morselized allograft 9. Use of microscope for microdissection  SURGEON: Dr. Duffy Rhody, MD  ASSISTANT: Dr. Granville Lewis, MD. Please note, there were no qualified trainees available to assist with the procedure.  An assistant was required for aid in retraction of the neural elements.   ANESTHESIA: General Endotracheal  EBL: 100 ml  IMPLANTS:  Medtronic 6.5 x 45 mm screws at L3, L4, and 7.5 x 45 mm screws at L5 80 mm rods L3-4: 9-15 mm x 22 lordosis expandable Catalyft cage L4-5: 7-75mm x 22 lordosis expandable Catalyft cage  SPECIMENS: None  DRAINS: None  COMPLICATIONS: none  CONDITION: Stable to PACU  HISTORY: Carlos Butler is a 81 y.o. male who initially presented to the outpatient clinic with right leg radiculopathy and back pain, worsening over several years despite nonsurgical therapies. MRI showed severe facet arthropathy at L3-4 with distraction a ruptured right medial facet capsule with severe L4 nerve impingement. This segment demonstrated dynamic instability with positional listhesis. He also had severe L4-5 disc degeneration with disc space collapsewith L5 nerve root impingement on the right.  Treatment options were discussed and the patient elected to proceed with MIS TLIF at L3-4, L4-5. Risks, benefits, alternatives, and expected convalescence were discussed with the patient.  Risks discussed  included but were not limited to bleeding, pain, infection, scar, pseudoarthrosis, CSF leak, neurologic deficit, paralysis, and death.  The patient wished to proceed with surgery and informed consent was obtained.  PROCEDURE IN DETAIL: After informed consent was obtained and witnessed, the patient was brought to the operating room. After induction of general anesthesia, the patient was positioned on the operative table in the prone position on a Jackson table with all pressure points meticulously padded. The skin of the low back was then prepped and draped in the usual sterile fashion.  Under fluoroscopy, the pedicles of the correct levels were identified and marked out on the skin, and after timeout was conducted, the skin was infiltrated with local anesthetic.  A 10 blade was used to incise the skin and the fascia overlying the pedicles on [facility] fluoroscopy.  Jamshidi's were then placed at the lateral aspect of the pedicle is identified AP fluoroscopy and malleted into the body under x-ray guidance.  C-arm was then switched to lateral which confirmed entry into the body.  K wires were then passed through the Jamshidi's and the sheaths were removed.  The tubular retractor system was then docked on the right L3-4 facet joint under fluoroscopic guidance.  Sequential dilators and final tubular retractor was placed and locked in position under fluoroscopic guidance.  The microscope was then introduced in the field to allow for intraoperative microdissection.  Remaining muscle was moved off of the degenerated facet joint.  The inferior facets was removed using an osteotome and harvested for autograft.  Overgrown superior facets was then removed with rongeurs.  Large amount of degenerative facet material and cartilage was noted in the epidural space and was dissected from the  traversing nerve root. Good decompression was performed with removal of overgrown ligament  and facets with rongeurs. Removal of the lamina and  underlying ligamentum flavum was performed, decompressing the thecal sac. The exiting nerve root in the foramen was also seen.  Epidural veins in the foramen were coagulated and cut with allowed access to the disc space lateral to the traversing nerve root.  The traversing nerve root was gently retracted medially to allow greater access to the disc.  The disc was opened with 15 blade and pituitary rongeur was used to remove disc material.  Disc shavers of increasing size was then used to clean the disc space as well as to restore disc space height.  The interbody space was prepared with rasps and curettes with removal of the cartilage from the endplates.  Appropriate sized interbody spacer was then sized.  The inner body space was packed with morselized allograft mixed with autograft.  An expandable interbody spacer was then tamped into place with a mallet under x-ray guidance.  He was then expanded until snug with the endplates.  AP and lateral x-ray confirmed good position of the implant.  The wound was then irrigated thoroughly with bacitracin impregnated irrigation and meticulous hemostasis was obtained.  The retractor was then removed and meticulous hemostasis was obtained in the muscle layer and subcutaneous layer.    The tubular retractor system was then docked on the right L4-5 facet joint under fluoroscopic guidance.  Sequential dilators and final tubular retractor was placed and locked in position under fluoroscopic guidance.  The inferior facets was removed using an osteotome and harvested for autograft.  Overgrown superior facets was then removed with rongeurs.  Ligamentum flavum was removed, exposing the traversing nerve root. Good decompression was performed with removal of overgrown ligament and facets with rongeurs.  The exiting nerve root in the foramen was also seen.  Epidural veins in the foramen were coagulated and cut with allowed access to the disc space lateral to the traversing nerve root.   The traversing nerve root was gently retracted medially to allow greater access to the disc.  The disc was opened with 15 blade and pituitary rongeur was used to remove disc material.  Disc shavers of increasing size was then used to clean the disc space as well as to restore disc space height.  The interbody space was prepared with rasps and curettes with removal of the cartilage from the endplates.  Appropriate sized interbody spacer was then sized.  The inner body space was packed with morselized allograft mixed with autograft.  An expandable interbody spacer was then tamped into place with a mallet under x-ray guidance.  He was then expanded until snug with the endplates.  AP and lateral x-ray confirmed good position of the implant.  The wound was then irrigated thoroughly with bacitracin impregnated irrigation and meticulous hemostasis was obtained.  The retractor was then removed and meticulous hemostasis was obtained in the muscle layer and subcutaneous layer.  Pedicle screws were passed over the K-wires and placed at each level under fluoroscopic guidance.  Good purchase was noted.  Rods were then passed through the screw towers bilaterally and secured with screw caps.  X-ray confirmed good placement.  The screw caps were final tightened and the towers removed.  Wounds were irrigated thoroughly with bacitracin impregnated irrigation.  Exparel mixed with Marcaine was injected into the paraspinous muscles and subcutaneous tissues bilaterally.  The fascia was closed with 0 Vicryl stitches.  The dermal layer was closed with 2-0 Vicryl stitches in buried interrupted fashion.  The skin incisions  were closed with 4-0 Monocryl subcuticular manner followed by Dermabond.  Sterile dressings were placed.  Patient was then flipped supine and extubated by the anesthesia service following commands and all 4 extremities.  All counts were correct at the end of surgery.  No complications were noted.

## 2021-10-29 NOTE — Transfer of Care (Signed)
Immediate Anesthesia Transfer of Care Note  Patient: Carlos Butler  Procedure(s) Performed: MINIMALLY INVASIVE TRANSFORAMINAL LUMBAR INTERBODY FUSION RIGHT LUMBAR THREE-FOUR, LUMBAR FOUR-FIVE (Spine Lumbar)  Patient Location: PACU  Anesthesia Type:General  Level of Consciousness: drowsy and patient cooperative  Airway & Oxygen Therapy: Patient Spontanous Breathing and Patient connected to face mask oxygen  Post-op Assessment: Report given to RN and Post -op Vital signs reviewed and stable  Post vital signs: Reviewed and stable  Last Vitals:  Vitals Value Taken Time  BP 106/80 10/29/21 1557  Temp    Pulse 82 10/29/21 1559  Resp 14 10/29/21 1559  SpO2 95 % 10/29/21 1559  Vitals shown include unvalidated device data.  Last Pain:  Vitals:   10/29/21 0807  TempSrc:   PainSc: 0-No pain         Complications: No notable events documented.

## 2021-10-29 NOTE — H&P (Signed)
CC: right leg pain/back pain  HPI:     Patient is a 81 y.o. Butler presented with back pain and right leg pain.  He was found to have severe stenosis and degenerative disease in his lumbar spine.  Nonsurgical measures failed to improve his symptoms.    Patient Active Problem List   Diagnosis Date Noted   Family history of colonic polyps 02/20/2012   Past Medical History:  Diagnosis Date   Arthritis    Complication of anesthesia    10/25/21- years ago- slow to awaken   Diverticulosis    Hemorrhoids    Hyperlipidemia    Hypothyroidism    Vertigo     Past Surgical History:  Procedure Laterality Date   COLONOSCOPY  01/03/2002   diverticulosis   COLONOSCOPY  01/13/2007   Dr. Gala Romney- small external hemorrhoidal tag, L side and transverse diverticula   COLONOSCOPY  03/05/2012   Procedure: COLONOSCOPY;  Surgeon: Daneil Dolin, MD;  Location: [facility];  Service: Endoscopy;  Laterality: N/A;  7:30   COLONOSCOPY N/A 04/24/2017   Procedure: COLONOSCOPY;  Surgeon: Daneil Dolin, MD;  Location: [facility];  Service: Endoscopy;  Laterality: N/A;  [place]  03/14/2009   Dr. Romona Curls   HERNIA REPAIR     bilateral inguinal hernia    Medications Prior to Admission  Medication Sig Dispense Refill Last Dose   acetaminophen (TYLENOL) 500 MG tablet Take 500-1,000 mg by mouth every 6 (six) hours as needed for mild pain or moderate pain.   Past Week   Ascorbic Acid (VITAMIN C) 1000 MG tablet Take 1,000 mg by mouth daily.   10/28/2021   aspirin EC 81 MG tablet Take 81 mg by mouth every morning.    10/22/2021   atorvastatin (LIPITOR) 40 MG tablet Take 40 mg by mouth daily.   10/29/2021 at 0530   cholecalciferol (VITAMIN D3) 25 MCG (1000 UNIT) tablet Take 1,000 Units by mouth daily.   10/28/2021   Hydrocortisone (PROCTOZONE-HC EX) Place 1 application rectally daily as needed (hemorhoids).   10/29/2021 at 0530   ibuprofen (ADVIL,MOTRIN) 200 MG tablet Take 400 mg by mouth every 6 (six)  hours as needed for moderate pain.   Past Week   levothyroxine (SYNTHROID) 175 MCG tablet Take 175 mcg by mouth every morning.  1 10/29/2021 at 0530   zinc gluconate 50 MG tablet Take 50 mg by mouth daily.   10/28/2021   No Known Allergies  Social History   Tobacco Use   Smoking status: Never   Smokeless tobacco: Never  Substance Use Topics   Alcohol use: No    Family History  Problem Relation Age of Onset   Colon polyps Brother        malignant   Anesthesia problems Neg Hx    Hypotension Neg Hx    Malignant hyperthermia Neg Hx    Pseudochol deficiency Neg Hx      Review of Systems Pertinent items are noted in HPI.  Objective:   Patient Vitals for the past 8 hrs:  BP Temp Temp src Pulse Resp SpO2 Height Weight  10/29/21 0752 139/74 98.1 F (36.7 C) Oral 74 18 97 % 6\' 2"  (1.88 m) 98.4 kg   No intake/output data recorded. No intake/output data recorded.      General : Alert, cooperative, no distress, appears stated age   Head:  Normocephalic/atraumatic    Eyes: PERRL, conjunctiva/corneas clear, EOM's intact. Fundi could not be visualized Neck:  Supple Chest:  Respirations unlabored Chest wall: no tenderness or deformity Heart: Regular rate and rhythm Abdomen: Soft, nontender and nondistended Extremities: warm and well-perfused Skin: normal turgor, color and texture Neurologic:  Alert, oriented x 3.  Eyes open spontaneously. PERRL, EOMI, VFC, no facial droop. V1-3 intact.  No dysarthria, tongue protrusion symmetric.  CNII-XII intact. Normal strength, sensation and reflexes throughout.  No pronator drift, full strength in legs.  Positive SLR on right       Data ReviewCBC:  Lab Results  Component Value Date   WBC 6.4 10/25/2021   RBC 4.63 10/25/2021   BMP:  Lab Results  Component Value Date   GLUCOSE 101 (H) 10/25/2021   CO2 22 10/25/2021   BUN 11 10/25/2021   CREATININE 0.71 10/25/2021   CALCIUM 9.0 10/25/2021   Radiology review: see clinic note for  details.  Assessment:   Active Problems:   * No active hospital problems. *  Lumbar stenosis with radiculopathy  Plan:   - plan for L3-4 and L4-5 decompressions, TLIFs today.  Informed consent obtained.

## 2021-10-29 NOTE — Anesthesia Preprocedure Evaluation (Signed)
Anesthesia Evaluation  Patient identified by MRN, date of birth, ID band Patient awake    Reviewed: Allergy & Precautions, NPO status , Patient's Chart, lab work & pertinent test results  History of Anesthesia Complications Negative for: history of anesthetic complications  Airway Mallampati: I  TM Distance: >3 FB Neck ROM: Full    Dental  (+) Dental Advisory Given, Teeth Intact   Pulmonary neg pulmonary ROS,    breath sounds clear to auscultation       Cardiovascular negative cardio ROS   Rhythm:Regular     Neuro/Psych negative neurological ROS  negative psych ROS   GI/Hepatic negative GI ROS, Neg liver ROS,   Endo/Other  Hypothyroidism   Renal/GU negative Renal ROS     Musculoskeletal  (+) Arthritis ,   Abdominal   Peds  Hematology negative hematology ROS (+)   Anesthesia Other Findings   Reproductive/Obstetrics                             Anesthesia Physical Anesthesia Plan  ASA: 2  Anesthesia Plan: General   Post-op Pain Management: Tylenol PO (pre-op)   Induction: Intravenous  PONV Risk Score and Plan: 2 and Ondansetron and Dexamethasone  Airway Management Planned: Oral ETT  Additional Equipment: None  Intra-op Plan:   Post-operative Plan: Extubation in OR  Informed Consent: I have reviewed the patients History and Physical, chart, labs and discussed the procedure including the risks, benefits and alternatives for the proposed anesthesia with the patient or authorized representative who has indicated his/her understanding and acceptance.     Dental advisory given  Plan Discussed with: CRNA and Anesthesiologist  Anesthesia Plan Comments:         Anesthesia Quick Evaluation

## 2021-10-29 NOTE — Anesthesia Procedure Notes (Signed)
Procedure Name: Intubation Date/Time: 10/29/2021 10:34 AM Performed by: Thelma Comp, CRNA Pre-anesthesia Checklist: Patient identified, Emergency Drugs available, Suction available and Patient being monitored Patient Re-evaluated:Patient Re-evaluated prior to induction Oxygen Delivery Method: Circle System Utilized Preoxygenation: Pre-oxygenation with 100% oxygen Induction Type: IV induction Ventilation: Mask ventilation without difficulty Laryngoscope Size: Mac and 4 Grade View: Grade I Tube type: Oral Tube size: 7.5 mm Number of attempts: 1 Airway Equipment and Method: Stylet Placement Confirmation: ETT inserted through vocal cords under direct vision, positive ETCO2 and breath sounds checked- equal and bilateral Secured at: 23 cm Tube secured with: Tape Dental Injury: Teeth and Oropharynx as per pre-operative assessment

## 2021-10-30 ENCOUNTER — Encounter (HOSPITAL_COMMUNITY): Payer: Self-pay | Admitting: Neurosurgery

## 2021-10-30 MED ORDER — OXYCODONE-ACETAMINOPHEN 5-325 MG PO TABS
1.0000 | ORAL_TABLET | Freq: Four times a day (QID) | ORAL | 0 refills | Status: AC | PRN
Start: 2021-10-30 — End: 2022-10-30

## 2021-10-30 MED ORDER — DOCUSATE SODIUM 100 MG PO CAPS
100.0000 mg | ORAL_CAPSULE | Freq: Two times a day (BID) | ORAL | 2 refills | Status: AC
Start: 2021-10-30 — End: 2022-10-30

## 2021-10-30 MED ORDER — CYCLOBENZAPRINE HCL 10 MG PO TABS: 10.0000 mg | ORAL_TABLET | Freq: Three times a day (TID) | ORAL | 2 refills | Status: DC | PRN

## 2021-10-30 NOTE — Discharge Summary (Signed)
°  Physician Discharge Summary  Patient ID: Carlos Butler MRN: [id number] DOB/AGE: 1941-01-17 81 y.o.  Admit date: 10/29/2021 Discharge date: 10/30/2021  Admission Diagnoses:  Lumbar radiculopathy with instability  Discharge Diagnoses:  Same Principal Problem:   Lumbar radiculopathy   Discharged Condition: Stable  Hospital Course:  Carlos Butler is a 81 y.o. male who underwent elective L3-4 and L4-5 MIS TLIFs.  Postoperatively, he was admitted to the spine unit where he was mobilized with the help of PT and OT.  His pain was well controlled and he was voiding without difficulty.    An LSO brace was given to himHe reports that his  preoperative right leg pain and weakness was significantly improved.  He was deemed ready for discharge on 10/30/2021.  Treatments: Surgery - L3-4, L4-5 MIS TLIF  Discharge Exam: Blood pressure 93/61, pulse 70, temperature 98.5 F (36.9 C), temperature source Oral, resp. rate 18, height 6\' 2"  (1.88 m), weight 98.4 kg, SpO2 97 %. Awake, alert, oriented Speech fluent, appropriate CN grossly intact 5/5 BUE/BLE Wound c/d/i  Disposition: Discharge disposition: 06-Home-Health Care Svc       Discharge Instructions     Incentive spirometry RT   Complete by: As directed       Allergies as of 10/30/2021   No Known Allergies      Medication List     TAKE these medications    acetaminophen 500 MG tablet Commonly known as: TYLENOL Take 500-1,000 mg by mouth every 6 (six) hours as needed for mild pain or moderate pain.   aspirin EC 81 MG tablet Take 81 mg by mouth every morning.   atorvastatin 40 MG tablet Commonly known as: LIPITOR Take 40 mg by mouth daily.   cholecalciferol 25 MCG (1000 UNIT) tablet Commonly known as: VITAMIN D3 Take 1,000 Units by mouth daily.   cyclobenzaprine 10 MG tablet Commonly known as: FLEXERIL Take 1 tablet (10 mg total) by mouth 3 (three) times daily as needed for muscle spasms.   docusate sodium  100 MG capsule Commonly known as: Colace Take 1 capsule (100 mg total) by mouth 2 (two) times daily.   ibuprofen 200 MG tablet Commonly known as: ADVIL Take 400 mg by mouth every 6 (six) hours as needed for moderate pain.   levothyroxine 175 MCG tablet Commonly known as: SYNTHROID Take 175 mcg by mouth every morning.   oxyCODONE-acetaminophen 5-325 MG tablet Commonly known as: Percocet Take 1-2 tablets by mouth every 6 (six) hours as needed for severe pain.   PROCTOZONE-HC EX Place 1 application rectally daily as needed (hemorhoids).   vitamin C 1000 MG tablet Take 1,000 mg by mouth daily.   zinc gluconate 50 MG tablet Take 50 mg by mouth daily.        Follow-up Information     [facility] Follow up.   Specialty: Home Health Services Contact information: [place] [address] [phone]         Vallarie Mare, MD Follow up in 2 week(s).   Specialty: Neurosurgery Contact information: [address] [place] [address] [phone]                 Signed: Vallarie Mare 10/30/2021, 1:08 PM

## 2021-10-30 NOTE — Evaluation (Signed)
Physical Therapy Evaluation Patient Details Name: Carlos Butler MRN: [id number] DOB: 06-Jul-1941 Today's Date: 10/30/2021  History of Present Illness  Pt is a 81 y.o. M who presents s/p L3-5 TLIF 10/29/2021. Significant PMH: vertigo.  Clinical Impression  Pt s/p procedure listed above. Presents with fair pain control and denies radicular symptoms. Pt presents with generalized weakness, balance deficits, and gait abnormalities. Requiring min assist for transfers and ambulating 400 feet with a walker at a min guard assist level. Education provided to pt/pt niece regarding spinal precautions, brace use, activity recommendations, appropriate DME, and home set up. Will benefit from HHPT at discharge.     Recommendations for follow up therapy are one component of a multi-disciplinary discharge planning process, led by the attending physician.  Recommendations may be updated based on patient status, additional functional criteria and insurance authorization.  Follow Up Recommendations Home health PT    Assistance Recommended at Discharge PRN  Patient can return home with the following  A little help with walking and/or transfers;A little help with bathing/dressing/bathroom;Assistance with cooking/housework;Help with stairs or ramp for entrance    Equipment Recommendations Rolling walker (2 wheels)  Recommendations for Other Services       Functional Status Assessment Patient has had a recent decline in their functional status and demonstrates the ability to make significant improvements in function in a reasonable and predictable amount of time.     Precautions / Restrictions Precautions Precautions: Back;Fall Precaution Booklet Issued: Yes (comment) Precaution Comments: verbally reviewed with pt/pt niece and provided written handout Required Braces or Orthoses: Spinal Brace Spinal Brace: Lumbar corset;Applied in sitting position Restrictions Weight Bearing Restrictions: No       Mobility  Bed Mobility Overal bed mobility: Needs Assistance Bed Mobility: Rolling, Sidelying to Sit Rolling: Modified independent (Device/Increase time) Sidelying to sit: Modified independent (Device/Increase time)       General bed mobility comments: increased time/effort, use of bed rail, cues for log roll technique. no physical assist required    Transfers Overall transfer level: Needs assistance Equipment used: Rolling walker (2 wheels) Transfers: Sit to/from Stand Sit to Stand: Min assist           General transfer comment: cues for scooting forward to edge of seat, hand placement, glute/quad activation. minA to power up    Ambulation/Gait Ambulation/Gait assistance: Min guard Gait Distance (Feet): 400 Feet Assistive device: Rolling walker (2 wheels) Gait Pattern/deviations: Step-through pattern, Decreased stride length Gait velocity: decreased     General Gait Details: Moderate reliance through arms on walker, slightly flexed knees in midstance, min guard for safety. no gross imbalance noted. cues for walker proximity  Stairs            Wheelchair Mobility    Modified Rankin (Stroke Patients Only)       Balance Overall balance assessment: Needs assistance Sitting-balance support: Feet supported Sitting balance-Leahy Scale: Good     Standing balance support: No upper extremity supported, During functional activity Standing balance-Leahy Scale: Fair                               Pertinent Vitals/Pain Pain Assessment Pain Assessment: Faces Faces Pain Scale: Hurts little more Pain Location: surgical site Pain Descriptors / Indicators: Operative site guarding Pain Intervention(s): Monitored during session    Home Living Family/patient expects to be discharged to:: Private residence Living Arrangements: Alone Available Help at Discharge: Family (niece, cousin) Type of Home: House  Home Access: Stairs to enter   Entrance  Stairs-Number of Steps: 1   Home Layout: One level Home Equipment: Cane - single point      Prior Function Prior Level of Function : Independent/Modified Independent                     Hand Dominance        Extremity/Trunk Assessment   Upper Extremity Assessment Upper Extremity Assessment: Defer to OT evaluation    Lower Extremity Assessment Lower Extremity Assessment: Generalized weakness    Cervical / Trunk Assessment Cervical / Trunk Assessment: Back Surgery  Communication   Communication: HOH  Cognition Arousal/Alertness: Awake/alert Behavior During Therapy: WFL for tasks assessed/performed Overall Cognitive Status: Within Functional Limits for tasks assessed                                          General Comments      Exercises     Assessment/Plan    PT Assessment Patient needs continued PT services  PT Problem List Decreased strength;Decreased activity tolerance;Decreased balance;Decreased mobility;Pain       PT Treatment Interventions Gait training;DME instruction;Stair training;Therapeutic activities;Functional mobility training;Therapeutic exercise;Balance training;Patient/family education    PT Goals (Current goals can be found in the Care Plan section)  Acute Rehab PT Goals Patient Stated Goal: return to baseline PT Goal Formulation: With patient/family Time For Goal Achievement: 11/13/21 Potential to Achieve Goals: Good    Frequency Min 5X/week     Co-evaluation               AM-PAC PT "6 Clicks" Mobility  Outcome Measure Help needed turning from your back to your side while in a flat bed without using bedrails?: None Help needed moving from lying on your back to sitting on the side of a flat bed without using bedrails?: None Help needed moving to and from a bed to a chair (including a wheelchair)?: A Little Help needed standing up from a chair using your arms (e.g., wheelchair or bedside chair)?: A  Little Help needed to walk in hospital room?: A Little Help needed climbing 3-5 steps with a railing? : A Little 6 Click Score: 20    End of Session Equipment Utilized During Treatment: Back brace Activity Tolerance: Patient tolerated treatment well Patient left: in chair;with call bell/phone within reach Nurse Communication: Mobility status PT Visit Diagnosis: Unsteadiness on feet (R26.81);Muscle weakness (generalized) (M62.81);Difficulty in walking, not elsewhere classified (R26.2);Pain Pain - part of body:  (back)    Time: 0828-0906 PT Time Calculation (min) (ACUTE ONLY): 38 min   Charges:   PT Evaluation $PT Eval Low Complexity: 1 Low PT Treatments $Gait Training: 8-22 mins $Self Care/Home Management: 8-22        Wyona Almas, PT, DPT Acute Rehabilitation Services Pager [phone] Office [phone]   Deno Etienne 10/30/2021, 9:46 AM

## 2021-10-30 NOTE — Plan of Care (Signed)
Pt doing well. Pt given D/C instructions with verbal understanding. Rx's were sent to the pharmacy by MD. Pt's incision is clean and dry with no sign of infection. Pt's IV was removed prior to D/C. Home Health was arranged per MD order. Pt received RW and 3-n-1 from [facility] per MD order. Pt D/C'd home via wheelchair per MD order. Pt is stable @ D/C and has no other needs at this time. Holli Humbles, RN

## 2021-10-30 NOTE — TOC Initial Note (Addendum)
Transition of Care [facility]) - Initial/Assessment Note    Patient Details  Name: WESTLY HINNANT MRN: [id number] Date of Birth: 1940-12-23  Transition of Care [facility]) CM/SW Contact:    Marilu Favre, RN Phone Number: 10/30/2021, 10:24 AM  Clinical Narrative:                  Spoke to patient and niece at bedside. Patient from home alone. Confirmed face sheet information.   Patient plans to return to home and niece and nephew will take turns staying with him.   Cory with [facility] accepted referral for HHPT. Will need home health orders and face to face   3C staff will provide any DME needed. Expected Discharge Plan: [place] Barriers to Discharge: No Barriers Identified   Patient Goals and CMS Choice Patient states their goals for this hospitalization and ongoing recovery are:: to return home CMS Medicare.gov Compare Post Acute Care list provided to:: Patient Choice offered to / list presented to : Patient  Expected Discharge Plan and Services Expected Discharge Plan: [place] Choice: [place] arrangements for the past 2 months: Single Family Home                   DME Agency: NA       HH Arranged: PT HH Agency: [place] Date [place]: 10/30/21 Time [place]: 38 Representative spoke with at [facility]: Tommi Rumps  Prior Living Arrangements/Services Living arrangements for the past 2 months: [place] Lives with:: Self Patient language and need for interpreter reviewed:: Yes Do you feel safe going back to the place where you live?: Yes      Need for Family Participation in Patient Care: Yes (Comment) Care giver support system in place?: Yes (comment)   Criminal Activity/Legal Involvement Pertinent to Current Situation/Hospitalization: No - Comment as needed  Activities of Daily Living      Permission Sought/Granted   Permission granted to  share information with : Yes, Verbal Permission Granted  Share Information with NAME: Niece  Permission granted to share info w AGENCY: [facility]        Emotional Assessment Appearance:: Appears stated age Attitude/Demeanor/Rapport: Engaged Affect (typically observed): Accepting Orientation: : Oriented to Self, Oriented to Place, Oriented to  Time, Oriented to Situation Alcohol / Substance Use: Not Applicable Psych Involvement: No (comment)  Admission diagnosis:  Lumbar radiculopathy [M54.16] Patient Active Problem List   Diagnosis Date Noted   Lumbar radiculopathy 10/29/2021   Family history of colonic polyps 02/20/2012   PCP:  Celene Squibb, MD Pharmacy:   [facility], [address] [place] [address] Phone: [phone] Fax: [phone]     Social Determinants of Health (SDOH) Interventions    Readmission Risk Interventions No flowsheet data found.

## 2021-10-30 NOTE — Anesthesia Postprocedure Evaluation (Signed)
Anesthesia Post Note  Patient: MILDRED BOLLARD  Procedure(s) Performed: MINIMALLY INVASIVE TRANSFORAMINAL LUMBAR INTERBODY FUSION RIGHT LUMBAR THREE-FOUR, LUMBAR FOUR-FIVE (Spine Lumbar)     Patient location during evaluation: PACU Anesthesia Type: General Level of consciousness: awake and alert Pain management: pain level controlled Vital Signs Assessment: post-procedure vital signs reviewed and stable Respiratory status: spontaneous breathing, nonlabored ventilation, respiratory function stable and patient connected to nasal cannula oxygen Cardiovascular status: blood pressure returned to baseline and stable Postop Assessment: no apparent nausea or vomiting Anesthetic complications: no   No notable events documented.  Last Vitals:  Vitals:   10/29/21 2308 10/30/21 0354  BP: 113/61 (!) 109/54  Pulse: 71 64  Resp: 20 20  Temp: 36.9 C 36.7 C  SpO2: 95% 97%    Last Pain:  Vitals:   10/30/21 0506  TempSrc:   PainSc: [place] Maleeha Halls

## 2021-10-30 NOTE — Evaluation (Signed)
Occupational Therapy Evaluation Patient Details Name: Carlos Butler MRN: [id number] DOB: 10/15/1940 Today's Date: 10/30/2021   History of Present Illness Pt is a 81 y.o. M who presents s/p L3-5 TLIF 10/29/2021. Significant PMH: vertigo.   Clinical Impression   Pt admitted for procedure listed above. PTA pt reported that he was independent with all ADL's and working as a Psychologist, sport and exercise. At this time, pt is limited by balance deficits, pain, and weakness. He is requiring min guard for all ADL's and functional mobility for safety. Educated on compensatory strategies, pt has no further acute OT needs and OT will sign off.      Recommendations for follow up therapy are one component of a multi-disciplinary discharge planning process, led by the attending physician.  Recommendations may be updated based on patient status, additional functional criteria and insurance authorization.   Follow Up Recommendations  No OT follow up    Assistance Recommended at Discharge PRN  Patient can return home with the following A little help with walking and/or transfers;A little help with bathing/dressing/bathroom;Assistance with cooking/housework;Assist for transportation    Functional Status Assessment  Patient has had a recent decline in their functional status and demonstrates the ability to make significant improvements in function in a reasonable and predictable amount of time.  Equipment Recommendations  Other (comment) (RW)    Recommendations for Other Services       Precautions / Restrictions Precautions Precautions: Back;Fall Precaution Booklet Issued: Yes (comment) Precaution Comments: verbally reviewed with pt/pt niece and provided written handout Required Braces or Orthoses: Spinal Brace Spinal Brace: Lumbar corset;Applied in sitting position Restrictions Weight Bearing Restrictions: No      Mobility Bed Mobility Overal bed mobility: Needs Assistance             General bed mobility  comments: Reviewed log roll technique, pt up in chair on OT arrival    Transfers Overall transfer level: Needs assistance Equipment used: Rolling walker (2 wheels) Transfers: Sit to/from Stand Sit to Stand: Min guard           General transfer comment: cues for scooting forward to edge of seat, hand placement, glute/quad activation. increased time to power up      Balance Overall balance assessment: Needs assistance Sitting-balance support: Feet supported Sitting balance-Leahy Scale: Good     Standing balance support: No upper extremity supported, During functional activity Standing balance-Leahy Scale: Fair                             ADL either performed or assessed with clinical judgement   ADL Overall ADL's : Needs assistance/impaired                                       General ADL Comments: Pt overall at min guard level for BADL's due to balance deficits and pain limiting movements     Vision Baseline Vision/History: 1 Wears glasses Ability to See in Adequate Light: 0 Adequate Patient Visual Report: No change from baseline Vision Assessment?: No apparent visual deficits     Perception     Praxis      Pertinent Vitals/Pain Pain Assessment Pain Assessment: 0-10 Pain Score: 3  Pain Location: surgical site Pain Descriptors / Indicators: Operative site guarding Pain Intervention(s): Monitored during session, Repositioned     Hand Dominance     Extremity/Trunk Assessment Upper  Extremity Assessment Upper Extremity Assessment: Overall WFL for tasks assessed   Lower Extremity Assessment Lower Extremity Assessment: Defer to PT evaluation   Cervical / Trunk Assessment Cervical / Trunk Assessment: Back Surgery   Communication Communication Communication: HOH   Cognition Arousal/Alertness: Awake/alert Behavior During Therapy: WFL for tasks assessed/performed Overall Cognitive Status: Within Functional Limits for tasks  assessed                                       General Comments  VSS on RA, dressing dry and intact, IV pulled outduring dressing, RN notified.    Exercises     Shoulder Instructions      Home Living Family/patient expects to be discharged to:: Private residence Living Arrangements: Alone Available Help at Discharge: Family (niece, cousin) Type of Home: House Home Access: Stairs to enter Technical brewer of Steps: 1   Home Layout: One level     Bathroom Shower/Tub: Teacher, early years/pre: Handicapped height     Home Equipment: [place] - single point          Prior Functioning/Environment Prior Level of Function : Independent/Modified Independent                        OT Problem List: Decreased strength;Decreased activity tolerance;Impaired balance (sitting and/or standing);Decreased knowledge of use of DME or AE;Pain      OT Treatment/Interventions:      OT Goals(Current goals can be found in the care plan section) Acute Rehab OT Goals Patient Stated Goal: To go home OT Goal Formulation: With patient Time For Goal Achievement: 10/30/21 Potential to Achieve Goals: Good  OT Frequency:      Co-evaluation              AM-PAC OT "6 Clicks" Daily Activity     Outcome Measure Help from another person eating meals?: None Help from another person taking care of personal grooming?: None Help from another person toileting, which includes using toliet, bedpan, or urinal?: A Little Help from another person bathing (including washing, rinsing, drying)?: A Little Help from another person to put on and taking off regular upper body clothing?: None Help from another person to put on and taking off regular lower body clothing?: A Little 6 Click Score: 21   End of Session Equipment Utilized During Treatment: Rolling walker (2 wheels);Back brace Nurse Communication: Mobility status  Activity Tolerance: Patient tolerated  treatment well Patient left: in chair;with call bell/phone within reach;with family/visitor present  OT Visit Diagnosis: Unsteadiness on feet (R26.81);Other abnormalities of gait and mobility (R26.89);Muscle weakness (generalized) (M62.81)                Time: 3893-7342 OT Time Calculation (min): 37 min Charges:  OT General Charges $OT Visit: 1 Visit OT Evaluation $OT Eval Moderate Complexity: 1 Mod OT Treatments $Self Care/Home Management : 8-22 mins  Carlos Mccort H., OTR/L Acute Rehabilitation  Carlos Butler Elane Lakethia Coppess 10/30/2021, 10:22 AM

## 2021-11-02 DIAGNOSIS — Z4789 Encounter for other orthopedic aftercare: Secondary | ICD-10-CM | POA: Diagnosis not present

## 2021-11-02 DIAGNOSIS — Z981 Arthrodesis status: Secondary | ICD-10-CM | POA: Diagnosis not present

## 2021-11-02 DIAGNOSIS — Z9181 History of falling: Secondary | ICD-10-CM | POA: Diagnosis not present

## 2021-11-02 DIAGNOSIS — Z7982 Long term (current) use of aspirin: Secondary | ICD-10-CM | POA: Diagnosis not present

## 2021-11-02 DIAGNOSIS — R21 Rash and other nonspecific skin eruption: Secondary | ICD-10-CM | POA: Diagnosis not present

## 2021-11-06 ENCOUNTER — Ambulatory Visit: Payer: Medicare Other | Admitting: Gastroenterology

## 2021-11-26 DIAGNOSIS — L308 Other specified dermatitis: Secondary | ICD-10-CM | POA: Diagnosis not present

## 2021-12-05 DIAGNOSIS — Z981 Arthrodesis status: Secondary | ICD-10-CM | POA: Diagnosis not present

## 2021-12-05 DIAGNOSIS — R21 Rash and other nonspecific skin eruption: Secondary | ICD-10-CM | POA: Diagnosis not present

## 2021-12-05 DIAGNOSIS — Z7982 Long term (current) use of aspirin: Secondary | ICD-10-CM | POA: Diagnosis not present

## 2021-12-05 DIAGNOSIS — Z9181 History of falling: Secondary | ICD-10-CM | POA: Diagnosis not present

## 2021-12-05 DIAGNOSIS — Z4789 Encounter for other orthopedic aftercare: Secondary | ICD-10-CM | POA: Diagnosis not present

## 2021-12-12 DIAGNOSIS — M5416 Radiculopathy, lumbar region: Secondary | ICD-10-CM | POA: Diagnosis not present

## 2021-12-12 DIAGNOSIS — R600 Localized edema: Secondary | ICD-10-CM | POA: Diagnosis not present

## 2021-12-13 DIAGNOSIS — M5416 Radiculopathy, lumbar region: Secondary | ICD-10-CM | POA: Diagnosis not present

## 2022-01-28 DIAGNOSIS — H903 Sensorineural hearing loss, bilateral: Secondary | ICD-10-CM | POA: Diagnosis not present

## 2022-02-14 DIAGNOSIS — M5416 Radiculopathy, lumbar region: Secondary | ICD-10-CM | POA: Diagnosis not present

## 2022-02-21 DIAGNOSIS — E039 Hypothyroidism, unspecified: Secondary | ICD-10-CM | POA: Diagnosis not present

## 2022-02-21 DIAGNOSIS — E782 Mixed hyperlipidemia: Secondary | ICD-10-CM | POA: Diagnosis not present

## 2022-02-21 DIAGNOSIS — Z125 Encounter for screening for malignant neoplasm of prostate: Secondary | ICD-10-CM | POA: Diagnosis not present

## 2022-02-21 DIAGNOSIS — R7303 Prediabetes: Secondary | ICD-10-CM | POA: Diagnosis not present

## 2022-02-26 DIAGNOSIS — M542 Cervicalgia: Secondary | ICD-10-CM | POA: Diagnosis not present

## 2022-02-26 DIAGNOSIS — R7303 Prediabetes: Secondary | ICD-10-CM | POA: Diagnosis not present

## 2022-02-26 DIAGNOSIS — E039 Hypothyroidism, unspecified: Secondary | ICD-10-CM | POA: Diagnosis not present

## 2022-02-26 DIAGNOSIS — E782 Mixed hyperlipidemia: Secondary | ICD-10-CM | POA: Diagnosis not present

## 2022-04-16 DIAGNOSIS — R600 Localized edema: Secondary | ICD-10-CM | POA: Diagnosis not present

## 2022-04-17 ENCOUNTER — Other Ambulatory Visit (HOSPITAL_COMMUNITY): Payer: Self-pay | Admitting: Family Medicine

## 2022-04-17 ENCOUNTER — Other Ambulatory Visit: Payer: Self-pay | Admitting: Family Medicine

## 2022-04-17 DIAGNOSIS — R600 Localized edema: Secondary | ICD-10-CM

## 2022-04-18 ENCOUNTER — Other Ambulatory Visit (HOSPITAL_COMMUNITY): Payer: Self-pay | Admitting: Family Medicine

## 2022-04-18 ENCOUNTER — Ambulatory Visit (HOSPITAL_COMMUNITY)
Admission: RE | Admit: 2022-04-18 | Discharge: 2022-04-18 | Disposition: A | Discharge status: Home / Self Care | Payer: Medicare Other | Source: Ambulatory Visit | Attending: Family Medicine | Admitting: Family Medicine

## 2022-04-18 DIAGNOSIS — R600 Localized edema: Secondary | ICD-10-CM | POA: Diagnosis not present

## 2022-05-23 DIAGNOSIS — Z6827 Body mass index (BMI) 27.0-27.9, adult: Secondary | ICD-10-CM | POA: Diagnosis not present

## 2022-05-23 DIAGNOSIS — M5416 Radiculopathy, lumbar region: Secondary | ICD-10-CM | POA: Diagnosis not present

## 2022-08-05 DIAGNOSIS — H524 Presbyopia: Secondary | ICD-10-CM | POA: Diagnosis not present

## 2022-09-01 DIAGNOSIS — E039 Hypothyroidism, unspecified: Secondary | ICD-10-CM | POA: Diagnosis not present

## 2022-09-01 DIAGNOSIS — E782 Mixed hyperlipidemia: Secondary | ICD-10-CM | POA: Diagnosis not present

## 2022-09-01 DIAGNOSIS — R7303 Prediabetes: Secondary | ICD-10-CM | POA: Diagnosis not present

## 2022-09-08 DIAGNOSIS — E782 Mixed hyperlipidemia: Secondary | ICD-10-CM | POA: Diagnosis not present

## 2022-09-08 DIAGNOSIS — R7303 Prediabetes: Secondary | ICD-10-CM | POA: Diagnosis not present

## 2022-09-08 DIAGNOSIS — M542 Cervicalgia: Secondary | ICD-10-CM | POA: Diagnosis not present

## 2022-09-08 DIAGNOSIS — E039 Hypothyroidism, unspecified: Secondary | ICD-10-CM | POA: Diagnosis not present

## 2022-09-29 DIAGNOSIS — M5416 Radiculopathy, lumbar region: Secondary | ICD-10-CM | POA: Diagnosis not present

## 2022-09-29 DIAGNOSIS — Z6828 Body mass index (BMI) 28.0-28.9, adult: Secondary | ICD-10-CM | POA: Diagnosis not present

## 2022-10-14 DIAGNOSIS — K080 Exfoliation of teeth due to systemic causes: Secondary | ICD-10-CM | POA: Diagnosis not present

## 2023-01-14 DIAGNOSIS — L308 Other specified dermatitis: Secondary | ICD-10-CM | POA: Diagnosis not present

## 2023-03-04 DIAGNOSIS — Z125 Encounter for screening for malignant neoplasm of prostate: Secondary | ICD-10-CM | POA: Diagnosis not present

## 2023-03-04 DIAGNOSIS — E782 Mixed hyperlipidemia: Secondary | ICD-10-CM | POA: Diagnosis not present

## 2023-03-04 DIAGNOSIS — R7303 Prediabetes: Secondary | ICD-10-CM | POA: Diagnosis not present

## 2023-03-04 DIAGNOSIS — E039 Hypothyroidism, unspecified: Secondary | ICD-10-CM | POA: Diagnosis not present

## 2023-03-05 DIAGNOSIS — Z0000 Encounter for general adult medical examination without abnormal findings: Secondary | ICD-10-CM | POA: Diagnosis not present

## 2023-03-10 DIAGNOSIS — M48061 Spinal stenosis, lumbar region without neurogenic claudication: Secondary | ICD-10-CM | POA: Diagnosis not present

## 2023-03-10 DIAGNOSIS — E039 Hypothyroidism, unspecified: Secondary | ICD-10-CM | POA: Diagnosis not present

## 2023-03-10 DIAGNOSIS — R7303 Prediabetes: Secondary | ICD-10-CM | POA: Diagnosis not present

## 2023-03-10 DIAGNOSIS — E782 Mixed hyperlipidemia: Secondary | ICD-10-CM | POA: Diagnosis not present

## 2023-05-18 DIAGNOSIS — K080 Exfoliation of teeth due to systemic causes: Secondary | ICD-10-CM | POA: Diagnosis not present

## 2023-09-03 DIAGNOSIS — R972 Elevated prostate specific antigen [PSA]: Secondary | ICD-10-CM | POA: Diagnosis not present

## 2023-09-03 DIAGNOSIS — E039 Hypothyroidism, unspecified: Secondary | ICD-10-CM | POA: Diagnosis not present

## 2023-09-03 DIAGNOSIS — E782 Mixed hyperlipidemia: Secondary | ICD-10-CM | POA: Diagnosis not present

## 2023-09-03 DIAGNOSIS — R7303 Prediabetes: Secondary | ICD-10-CM | POA: Diagnosis not present

## 2023-09-09 DIAGNOSIS — R600 Localized edema: Secondary | ICD-10-CM | POA: Diagnosis not present

## 2023-09-09 DIAGNOSIS — E039 Hypothyroidism, unspecified: Secondary | ICD-10-CM | POA: Diagnosis not present

## 2023-09-09 DIAGNOSIS — Z23 Encounter for immunization: Secondary | ICD-10-CM | POA: Diagnosis not present

## 2023-09-09 DIAGNOSIS — M48061 Spinal stenosis, lumbar region without neurogenic claudication: Secondary | ICD-10-CM | POA: Diagnosis not present

## 2023-09-09 DIAGNOSIS — R7303 Prediabetes: Secondary | ICD-10-CM | POA: Diagnosis not present

## 2023-09-09 DIAGNOSIS — E782 Mixed hyperlipidemia: Secondary | ICD-10-CM | POA: Diagnosis not present

## 2023-09-16 DIAGNOSIS — H524 Presbyopia: Secondary | ICD-10-CM | POA: Diagnosis not present

## 2023-10-20 ENCOUNTER — Encounter: Payer: Self-pay | Admitting: Internal Medicine

## 2023-10-20 ENCOUNTER — Ambulatory Visit (INDEPENDENT_AMBULATORY_CARE_PROVIDER_SITE_OTHER): Payer: Medicare Other | Admitting: Internal Medicine

## 2023-10-20 VITALS — BP 134/74 | HR 62 | Temp 97.8°F | Ht 72.0 in | Wt 223.6 lb

## 2023-10-20 DIAGNOSIS — K648 Other hemorrhoids: Secondary | ICD-10-CM | POA: Diagnosis not present

## 2023-10-20 DIAGNOSIS — R140 Abdominal distension (gaseous): Secondary | ICD-10-CM

## 2023-10-20 NOTE — Patient Instructions (Signed)
 It was good to see you again today!  Hemorrhoid information provided  Stop Metamucil  Begin Benefiber 2 tablespoons once daily  Continue taking Proctozone -HC-May use it twice daily  Avoid straining and limit toilet time to 5 minutes  You may or may not need to have hemorrhoid banding.  Pamphlet on hemorrhoid banding provided  Office visit here in 8 weeks to reassess

## 2023-10-20 NOTE — Progress Notes (Signed)
 Primary Care Physician:  Shona Norleen PEDLAR, MD Primary Gastroenterologist:  Dr. Shaaron  Pre-Procedure History & Physical: HPI:  Carlos Butler is a 83 y.o. male here for follow-up of symptomatic hemorrhoids.  Long history of symptomatic hemorrhoids.  Underwent banding 2010 by Dr. Floria.   I saw him 2 years ago; found grade 3 hemorrhoids.  Right anterior and posterior columns were banded with essentially resolution of his symptoms.  More recently has had issues with protrusion and difficulty with hygiene.  No bleeding.  Really no pain.  Started on some Proctozone -HC with marked improvement recently  He does take Metamucil twice daily complains of bloating.  He spends no more than 5 minutes on the toilet.  He denies constipation.  Denies passing hard stool. Last colonoscopy 2019-diverticulosis. Past Medical History:  Diagnosis Date   Arthritis    Complication of anesthesia    10/25/21- years ago- slow to awaken   Diverticulosis    Hemorrhoids    Hyperlipidemia    Hypothyroidism    Vertigo     Past Surgical History:  Procedure Laterality Date   COLONOSCOPY  01/03/2002   diverticulosis   COLONOSCOPY  01/13/2007   Dr. Shaaron- small external hemorrhoidal tag, L side and transverse diverticula   COLONOSCOPY  03/05/2012   Procedure: COLONOSCOPY;  Surgeon: Lamar CHRISTELLA Shaaron, MD;  Location: [facility];  Service: Endoscopy;  Laterality: N/A;  7:30   COLONOSCOPY N/A 04/24/2017   Procedure: COLONOSCOPY;  Surgeon: Shaaron Lamar CHRISTELLA, MD;  Location: [facility];  Service: Endoscopy;  Laterality: N/A;  730   HEMORRHOID SURGERY  03/14/2009   Dr. Floria   HERNIA REPAIR     bilateral inguinal hernia   TRANSFORAMINAL LUMBAR INTERBODY FUSION W/ MIS 2 LEVEL N/A 10/29/2021   Procedure: MINIMALLY INVASIVE TRANSFORAMINAL LUMBAR INTERBODY FUSION RIGHT LUMBAR THREE-FOUR, LUMBAR FOUR-FIVE;  Surgeon: Debby Dorn MATSU, MD;  Location: [facility] OR;  Service: Neurosurgery;  Laterality: N/A;    Prior to Admission  medications   Medication Sig Start Date End Date Taking? Authorizing Provider  Ascorbic Acid  (VITAMIN C) 1000 MG tablet Take 1,000 mg by mouth daily.   Yes [provider]  aspirin  EC 81 MG tablet Take 81 mg by mouth every morning.    Yes [provider]  atorvastatin  (LIPITOR) 40 MG tablet Take 40 mg by mouth daily.   Yes [provider]  cholecalciferol  (VITAMIN D3) 25 MCG (1000 UNIT) tablet Take 1,000 Units by mouth daily.   Yes [provider]  Hydrocortisone  (PROCTOZONE -HC EX) Place 1 application rectally daily as needed (hemorhoids).   Yes [provider]  levothyroxine  (SYNTHROID ) 175 MCG tablet Take 175 mcg by mouth every morning. 03/07/17  Yes [provider]    Allergies as of 10/20/2023   (No Known Allergies)    Family History  Problem Relation Age of Onset   Colon polyps Brother        malignant   Anesthesia problems Neg Hx    Hypotension Neg Hx    Malignant hyperthermia Neg Hx    Pseudochol deficiency Neg Hx     Social History   Socioeconomic History   Marital status: Single    Spouse name: Not on file   Number of children: Not on file   Years of education: Not on file   Highest education level: Not on file  Occupational History   Not on file  Tobacco Use   Smoking status: Never   Smokeless tobacco: Never  Substance and Sexual Activity   Alcohol use: No   Drug use: No   Sexual activity: Yes  Other Topics Concern   Not on file  Social History Narrative   Not on file   Social Drivers of Health   Financial Resource Strain: Not on file  Food Insecurity: Not on file  Transportation Needs: Not on file  Physical Activity: Not on file  Stress: Not on file  Social Connections: Not on file  Intimate Partner Violence: Not on file    Review of Systems: See HPI, otherwise negative ROS  Physical Exam: BP 134/74 (BP Location: Right Arm, Patient Position: Sitting, Cuff Size: Normal)   Pulse 62   Temp 97.8  F (36.6 C) (Oral)   Ht 6' (1.829 m)   Wt 223 lb 9.6 oz (101.4 kg)   SpO2 98%   BMI 30.33 kg/m  General:   Alert, elderly gentleman; pleasant and cooperative in NAD Abdomen: Nondistended.  Positive bowel sounds soft and nontender without appreciable mass organomegaly Rectal: Has some residual hemorrhoid cream at the anal orifice.  He has 2 small anal tags anorectal junction.  No external hemorrhoids.  Moderate sphincter tone.  No mass in rectal vault no stool in the rectal vault.   Impression/Plan: Pleasant 83 year old gentleman with symptomatic hemorrhoids (protrusion /pruritus) improved with topical hydrocortisone  recently.  Bloating with Metamucil - may be overdoing it a bit with this regimen.  No urgent need for banding at this time as his symptoms appear to be improving. History of grade 3 hemorrhoids.  Protruding columns at this time.  Recommendations:  Hemorrhoid information provided  Stop Metamucil  Begin Benefiber 2 tablespoons once daily  Continue taking Proctozone -HC-May use it twice daily  Avoid straining and limit toilet time to 5 minutes  May or may not need hemorrhoid banding in the future.  Pamphlet on hemorrhoid banding provided  Office visit here in 8 weeks to reassess  Hemorrhoid information provided Notice: This dictation was prepared with Dragon dictation along with smaller phrase technology. Any transcriptional errors that result from this process are unintentional and may not be corrected upon review.

## 2023-11-03 ENCOUNTER — Ambulatory Visit: Payer: Medicare Other | Admitting: Internal Medicine

## 2023-11-03 DIAGNOSIS — D225 Melanocytic nevi of trunk: Secondary | ICD-10-CM | POA: Diagnosis not present

## 2023-11-03 DIAGNOSIS — L308 Other specified dermatitis: Secondary | ICD-10-CM | POA: Diagnosis not present

## 2023-12-08 DIAGNOSIS — D225 Melanocytic nevi of trunk: Secondary | ICD-10-CM | POA: Diagnosis not present

## 2023-12-08 DIAGNOSIS — Z1283 Encounter for screening for malignant neoplasm of skin: Secondary | ICD-10-CM | POA: Diagnosis not present

## 2023-12-14 DIAGNOSIS — K080 Exfoliation of teeth due to systemic causes: Secondary | ICD-10-CM | POA: Diagnosis not present

## 2023-12-15 ENCOUNTER — Ambulatory Visit: Payer: Medicare Other | Admitting: Gastroenterology

## 2023-12-15 ENCOUNTER — Encounter: Payer: Self-pay | Admitting: Gastroenterology

## 2023-12-15 VITALS — BP 137/69 | HR 80 | Temp 98.0°F | Ht 73.0 in | Wt 219.4 lb

## 2023-12-15 DIAGNOSIS — K641 Second degree hemorrhoids: Secondary | ICD-10-CM

## 2023-12-15 NOTE — Patient Instructions (Signed)
  Please avoid straining.  You should limit your toilet time to 2-3 minutes at the most.   I recommend Benefiber 2  each morning in the beverage of your choice!  If no bowel movement on any given day, take 1 capful of Miralax in 8 ounces of water.   Please call me with any concerns or issues!  I will see you in follow-up for additional banding in several weeks.     It was a pleasure to see you today. I want to create trusting relationships with patients and provide genuine, compassionate, and quality care. I truly value your feedback, so please be on the lookout for a survey regarding your visit with me today. I appreciate your time in completing this!         Gelene Mink, PhD, ANP-BC [facility]

## 2023-12-15 NOTE — Progress Notes (Signed)
    [facility] BANDING PROCEDURE NOTE  Carlos Butler is a 83 y.o. male presenting today for consideration of hemorrhoid banding. Last colonoscopy 2091 with diverticulosis. Prior banding by Dr. Jena Gauss several years ago with right anterior and posterior columns banded. He has itching and occasional burning. Occasional straining. Benefiber daily.    The patient presents with symptomatic grade 2 hemorrhoids, unresponsive to maximal medical therapy, requesting rubber band ligation of his hemorrhoidal disease. All risks, benefits, and alternative forms of therapy were described and informed consent was obtained.  In the left lateral decubitus position, anoscopic examination revealed grade 2 hemorrhoids in the left lateral and right posterior position (s).  The decision was made to band the left lateral internal hemorrhoid, and the [facility]an System was used to perform band ligation without complication. Digital anorectal examination was then performed to assure proper positioning of the band, and to adjust the banded tissue as required. The patient was discharged home without pain or other issues. Dietary and behavioral recommendations were given, along with follow-up instructions. The patient will return in several weeks for followup and possible additional banding as required. (Right posterior)  No complications were encountered and the patient tolerated the procedure well.   Gelene Mink, PhD, ANP-BC [facility] Gastroenter[facility]

## 2024-01-28 ENCOUNTER — Ambulatory Visit (INDEPENDENT_AMBULATORY_CARE_PROVIDER_SITE_OTHER): Admitting: Gastroenterology

## 2024-01-28 ENCOUNTER — Encounter: Payer: Self-pay | Admitting: Gastroenterology

## 2024-01-28 VITALS — Temp 97.8°F | Ht 73.0 in | Wt 217.5 lb

## 2024-01-28 DIAGNOSIS — K641 Second degree hemorrhoids: Secondary | ICD-10-CM

## 2024-01-28 NOTE — Progress Notes (Signed)
    [facility] BANDING PROCEDURE NOTE  Carlos Butler is an 83 y.o. male presenting today for consideration of hemorrhoid banding. Last colonoscopy 2019 with diverticulosis. Prior banding by Dr. Riley Cheadle several years ago with right anterior and posterior columns banded. He has itching and occasional burning. Occasional straining. Anoscopy March 2025 with left lateral and right posterior hemorrhoids, s/p banding of left lateral.    The patient presents with symptomatic grade 2 hemorrhoids, unresponsive to maximal medical therapy, requesting rubber band ligation of his hemorrhoidal disease. All risks, benefits, and alternative forms of therapy were described and informed consent was obtained.   The decision was made to band the right posterior internal hemorrhoid, and the [facility] O'Regan System was used to perform band ligation without complication. Digital anorectal examination was then performed to assure proper positioning of the band, and to adjust the banded tissue as required. The patient was discharged home without pain or other issues. Dietary and behavioral recommendations were given, along with follow-up instructions. The patient will return in several weeks for followup and possible additional banding as required.  No complications were encountered and the patient tolerated the procedure well.   Delman Ferns, PhD, ANP-BC [facility] Gastroenterology

## 2024-01-28 NOTE — Patient Instructions (Signed)

## 2024-02-16 ENCOUNTER — Encounter: Payer: Self-pay | Admitting: Gastroenterology

## 2024-02-16 ENCOUNTER — Ambulatory Visit (INDEPENDENT_AMBULATORY_CARE_PROVIDER_SITE_OTHER): Admitting: Gastroenterology

## 2024-02-16 VITALS — BP 131/74 | HR 67 | Temp 97.3°F | Ht 72.0 in | Wt 216.9 lb

## 2024-02-16 DIAGNOSIS — Z09 Encounter for follow-up examination after completed treatment for conditions other than malignant neoplasm: Secondary | ICD-10-CM

## 2024-02-16 DIAGNOSIS — K648 Other hemorrhoids: Secondary | ICD-10-CM

## 2024-02-16 NOTE — Progress Notes (Signed)
 Gastroenterology Office Note     Primary Care Physician:  Omie Bickers, MD  Primary Gastroenterologist: Dr. Riley Cheadle    Chief Complaint   Chief Complaint  Patient presents with   Hemorrhoids    Patient here today for a follow up on hemorrhoids. Patient says he is doing well since his last visit and is not having any issues with the hemorrhoids currently.      History of Present Illness   Carlos Butler is an 83 y.o. male presenting today with a history of symptomatic hemorrhoids, s/p banding years ago of right anterior and posterior columns, with recurrent symptoms recently and banding of left lateral and right posterior.  He returns today in routine follow-up. Symptoms completely resolved. He takes metamucil and Miralax  daily. Rare straining. No bleeding, itching. No abdominal pain.   Denies GERD or dysphagia.   Last colonoscopy in 2019 with diverticulosis.     Past Medical History:  Diagnosis Date   Arthritis    Complication of anesthesia    10/25/21- years ago- slow to awaken   Diverticulosis    Hemorrhoids    Hyperlipidemia    Hypothyroidism    Vertigo     Past Surgical History:  Procedure Laterality Date   COLONOSCOPY  01/03/2002   diverticulosis   COLONOSCOPY  01/13/2007   Dr. Riley Cheadle- small external hemorrhoidal tag, L side and transverse diverticula   COLONOSCOPY  03/05/2012   Procedure: COLONOSCOPY;  Surgeon: Suzette Espy, MD;  Location[facility];  Service: Endoscopy;  Laterality: N/A;  7:30   COLONOSCOPY N/A 04/24/2017   Procedure: COLONOSCOPY;  Surgeon: Suzette Espy, MD;  Location: [facility];  Service: Endoscopy;  Laterality: N/A;  730   HEMORRHOID SURGERY  03/14/2009   Dr. Tina Fordyce   HERNIA REPAIR     bilateral inguinal hernia   TRANSFORAMINAL LUMBAR INTERBODY FUSION W/ MIS 2 LEVEL N/A 10/29/2021   Procedure: MINIMALLY INVASIVE TRANSFORAMINAL LUMBAR INTERBODY FUSION RIGHT LUMBAR THREE-FOUR, LUMBAR FOUR-FIVE;  Surgeon: Van Gelinas, MD;  Location: [facility];  Service: Neurosurgery;  Laterality: N/A;    Current Outpatient Medications  Medication Sig Dispense Refill   Ascorbic Acid  (VITAMIN C) 1000 MG tablet Take 1,000 mg by mouth daily.     aspirin  EC 81 MG tablet Take 81 mg by mouth every morning.      atorvastatin  (LIPITOR) 40 MG tablet Take 40 mg by mouth daily.     cholecalciferol  (VITAMIN D3) 25 MCG (1000 UNIT) tablet Take 1,000 Units by mouth daily.     Hydrocortisone  (PROCTOZONE -HC EX) Place 1 application rectally daily as needed (hemorhoids).     levothyroxine  (SYNTHROID ) 175 MCG tablet Take 175 mcg by mouth every morning.  1   triamcinolone cream (KENALOG) 0.1 % SMARTSIG:Topical 1-2 Times Daily PRN     No current facility-administered medications for this visit.    Allergies as of 02/16/2024   (No Known Allergies)    Family History  Problem Relation Age of Onset   Colon polyps Brother        malignant   Anesthesia problems Neg Hx    Hypotension Neg Hx    Malignant hyperthermia Neg Hx    Pseudochol deficiency Neg Hx     Social History   Socioeconomic History   Marital status: Single    Spouse name: Not on file   Number of children: Not on file   Years of education: Not on file   Highest  education level: Not on file  Occupational History   Not on file  Tobacco Use   Smoking status: Never   Smokeless tobacco: Never  Vaping Use   Vaping status: Never Used  Substance and Sexual Activity   Alcohol use: No   Drug use: No   Sexual activity: Yes  Other Topics Concern   Not on file  Social History Narrative   Not on file   Social Drivers of Health   Financial Resource Strain: Not on file  Food Insecurity: Not on file  Transportation Needs: Not on file  Physical Activity: Not on file  Stress: Not on file  Social Connections: Not on file  Intimate Partner Violence: Not on file     Review of Systems   Gen: Denies any fever, chills, fatigue, weight loss, lack of appetite.  CV: Denies  chest pain, heart palpitations, peripheral edema, syncope.  Resp: Denies shortness of breath at rest or with exertion. Denies wheezing or cough.  GI: Denies dysphagia or odynophagia. Denies jaundice, hematemesis, fecal incontinence. GU : Denies urinary burning, urinary frequency, urinary hesitancy MS: Denies joint pain, muscle weakness, cramps, or limitation of movement.  Derm: Denies rash, itching, dry skin Psych: Denies depression, anxiety, memory loss, and confusion Heme: Denies bruising, bleeding, and enlarged lymph nodes.   Physical Exam   BP 131/74 (BP Location: Left Arm, Patient Position: Sitting, Cuff Size: Normal)   Pulse 67   Temp (!) 97.3 F (36.3 C) (Temporal)   Ht 6' (1.829 m)   Wt 216 lb 14.4 oz (98.4 kg)   BMI 29.42 kg/m  General:   Alert and oriented. Pleasant and cooperative. Well-nourished and well-developed.  Head:  Normocephalic and atraumatic. Eyes:  Without icterus Abdomen:  +BS, soft, non-tender and non-distended. No HSM noted. No guarding or rebound. No masses appreciated.  Rectal:  Deferred  Msk:  Symmetrical without gross deformities. Normal posture. Extremities:  Without edema. Neurologic:  Alert and  oriented x4;  grossly normal neurologically. Skin:  Intact without significant lesions or rashes. Psych:  Alert and cooperative. Normal mood and affect.   Assessment/Plan   Carlos Butler is an  83 y.o. male presenting today with a history of symptomatic hemorrhoids, s/p banding years ago of right anterior and posterior columns, with recurrent symptoms recently and banding of left lateral and right posterior.  He has had resolution of symptoms completing and doing well with supplemental fiber and Miralax . Colonoscopy on file 2019. No alarm signs/symptoms.   As he is doing well, we will see him back as needed. No repeat colonoscopy due to age unless clinically indicated.     Delman Ferns, PhD, ANP-BC [facility]astroenterology

## 2024-02-16 NOTE — Patient Instructions (Signed)
 I am glad you are doing well!  Continue Metamucil and Miralax  daily as you are doing.  Please call us  with any recurrent symptoms.  We will see you as needed!  Happy early [place] Dar!  I enjoyed seeing you again today! I value our relationship and want to provide genuine, compassionate, and quality care. You may receive a survey regarding your visit with me, and I welcome your feedback! Thanks so much for taking the time to complete this. I look forward to seeing you again.      Delman Ferns, PhD, ANP-BC [facility] Gastroenterology

## 2024-03-18 DIAGNOSIS — E039 Hypothyroidism, unspecified: Secondary | ICD-10-CM | POA: Diagnosis not present

## 2024-03-18 DIAGNOSIS — R7303 Prediabetes: Secondary | ICD-10-CM | POA: Diagnosis not present

## 2024-03-18 DIAGNOSIS — E782 Mixed hyperlipidemia: Secondary | ICD-10-CM | POA: Diagnosis not present

## 2024-03-25 DIAGNOSIS — M48061 Spinal stenosis, lumbar region without neurogenic claudication: Secondary | ICD-10-CM | POA: Diagnosis not present

## 2024-03-25 DIAGNOSIS — R7303 Prediabetes: Secondary | ICD-10-CM | POA: Diagnosis not present

## 2024-03-25 DIAGNOSIS — E782 Mixed hyperlipidemia: Secondary | ICD-10-CM | POA: Diagnosis not present

## 2024-03-25 DIAGNOSIS — E039 Hypothyroidism, unspecified: Secondary | ICD-10-CM | POA: Diagnosis not present

## 2024-05-06 DIAGNOSIS — E039 Hypothyroidism, unspecified: Secondary | ICD-10-CM | POA: Diagnosis not present

## 2024-05-06 DIAGNOSIS — E663 Overweight: Secondary | ICD-10-CM | POA: Diagnosis not present

## 2024-06-27 DIAGNOSIS — K080 Exfoliation of teeth due to systemic causes: Secondary | ICD-10-CM | POA: Diagnosis not present

## 2024-09-19 DIAGNOSIS — R972 Elevated prostate specific antigen [PSA]: Secondary | ICD-10-CM | POA: Diagnosis not present

## 2024-09-19 DIAGNOSIS — R7303 Prediabetes: Secondary | ICD-10-CM | POA: Diagnosis not present

## 2024-09-19 DIAGNOSIS — E782 Mixed hyperlipidemia: Secondary | ICD-10-CM | POA: Diagnosis not present

## 2024-09-19 DIAGNOSIS — E039 Hypothyroidism, unspecified: Secondary | ICD-10-CM | POA: Diagnosis not present

## 2024-09-23 DIAGNOSIS — R7303 Prediabetes: Secondary | ICD-10-CM | POA: Diagnosis not present

## 2024-09-23 DIAGNOSIS — E039 Hypothyroidism, unspecified: Secondary | ICD-10-CM | POA: Diagnosis not present

## 2024-09-23 DIAGNOSIS — Z0001 Encounter for general adult medical examination with abnormal findings: Secondary | ICD-10-CM | POA: Diagnosis not present

## 2024-09-23 DIAGNOSIS — E782 Mixed hyperlipidemia: Secondary | ICD-10-CM | POA: Diagnosis not present

## 2024-09-23 DIAGNOSIS — Z23 Encounter for immunization: Secondary | ICD-10-CM | POA: Diagnosis not present

## 2024-09-23 DIAGNOSIS — Z0000 Encounter for general adult medical examination without abnormal findings: Secondary | ICD-10-CM | POA: Diagnosis not present

## 2024-09-23 DIAGNOSIS — K5909 Other constipation: Secondary | ICD-10-CM | POA: Diagnosis not present

## 2024-09-23 DIAGNOSIS — M48061 Spinal stenosis, lumbar region without neurogenic claudication: Secondary | ICD-10-CM | POA: Diagnosis not present

## 2025-01-03 ENCOUNTER — Ambulatory Visit: Admitting: Gastroenterology
